# Patient Record
Sex: Female | Born: 1990 | Race: White | Hispanic: No | Marital: Single | Smoking: Never smoker
Health system: Southern US, Community
[De-identification: ages and names within clinical notes are randomized; demographics above are authoritative.]

## PROBLEM LIST (undated history)

## (undated) ENCOUNTER — Inpatient Hospital Stay (HOSPITAL_COMMUNITY): Payer: Self-pay

## (undated) DIAGNOSIS — B019 Varicella without complication: Secondary | ICD-10-CM

## (undated) DIAGNOSIS — Z789 Other specified health status: Secondary | ICD-10-CM

## (undated) DIAGNOSIS — N760 Acute vaginitis: Secondary | ICD-10-CM

## (undated) HISTORY — PX: NO PAST SURGERIES: SHX2092

## (undated) HISTORY — PX: CHOLECYSTECTOMY: SHX55

## (undated) HISTORY — DX: Acute vaginitis: N76.0

## (undated) HISTORY — DX: Varicella without complication: B01.9

---

## 2009-07-13 ENCOUNTER — Encounter: Payer: Self-pay | Admitting: Family Medicine

## 2010-04-17 ENCOUNTER — Ambulatory Visit: Payer: Self-pay | Admitting: Family Medicine

## 2010-04-21 ENCOUNTER — Encounter: Payer: Self-pay | Admitting: Family Medicine

## 2010-04-21 ENCOUNTER — Ambulatory Visit (INDEPENDENT_AMBULATORY_CARE_PROVIDER_SITE_OTHER): Payer: BC Managed Care – PPO | Admitting: Family Medicine

## 2010-04-21 DIAGNOSIS — E663 Overweight: Secondary | ICD-10-CM | POA: Insufficient documentation

## 2010-04-21 DIAGNOSIS — Z87448 Personal history of other diseases of urinary system: Secondary | ICD-10-CM | POA: Insufficient documentation

## 2010-04-21 DIAGNOSIS — R197 Diarrhea, unspecified: Secondary | ICD-10-CM

## 2010-04-21 DIAGNOSIS — F909 Attention-deficit hyperactivity disorder, unspecified type: Secondary | ICD-10-CM

## 2010-04-21 DIAGNOSIS — R51 Headache: Secondary | ICD-10-CM

## 2010-04-21 DIAGNOSIS — F17200 Nicotine dependence, unspecified, uncomplicated: Secondary | ICD-10-CM | POA: Insufficient documentation

## 2010-04-21 DIAGNOSIS — R519 Headache, unspecified: Secondary | ICD-10-CM | POA: Insufficient documentation

## 2010-04-21 DIAGNOSIS — F329 Major depressive disorder, single episode, unspecified: Secondary | ICD-10-CM | POA: Insufficient documentation

## 2010-04-24 ENCOUNTER — Encounter: Payer: Self-pay | Admitting: Family Medicine

## 2010-04-24 ENCOUNTER — Ambulatory Visit: Payer: Self-pay | Admitting: Family Medicine

## 2010-04-25 ENCOUNTER — Ambulatory Visit: Payer: Self-pay | Admitting: Family Medicine

## 2010-05-04 NOTE — Assessment & Plan Note (Signed)
Summary: New pt est care/dt [employer]   Vital Signs:  Patient profile:   20 year old female Height:      61.25 inches Weight:      182.75 pounds BMI:     34.37 O2 Sat:      100 % on Room air Temp:     98.2 degrees F oral Pulse rate:   90 / minute BP sitting:   136 / 87  (right arm) Cuff size:   regular  Vitals Entered By: Francee Piccolo CMA Duncan Dull) (April 21, 2010 1:07 PM)  O2 Flow:  Room air CC: BM shortly after eating, began with just greasy foods, now all foods--cholecystectomy 2 years ago...SP Is Patient Diabetic? No   History of Present Illness: patient is an 20 year old Caucasian femalein today for new patient appointment. Her major complaint is diarrhea. She had a cholecystectomy in 2000 and tenderness had trouble ever since. She describes fecal urgency each time she eats. It is not affected by what she eats or time of day. It is better and worse at intervals but she denies any obvious ever sees bloody or tarry stoo,l no fevers, chills, does have some mild abdominal cramping with movement no other recent illness, CP, palp, GU concerns recently. Has had trouble with UTIs in the past. Has struggled with headaches at times, but no trouble at present. She has not tried any medications for the diarhhea. She reports the problem has gradually worsened since the surgery. Initially she thought it was mostly fatty foods but now it is all foods.  Preventive Screening-Counseling & Management  Alcohol-Tobacco     Smoking Status: current     Smoking Cessation Counseling: YES      Drug Use:  no.    Current Medications (verified): 1)  Nuvaring 0.12-0.015 Mg/24hr Ring (Etonogestrel-Ethinyl Estradiol) .... Change Monthly As Directed  Allergies (verified): 1)  ! Codeine  Past History:  Past Surgical History: Cholecystectomy at age 20  Family History: Father: 39: hyperlipidemia, HTN, overweight Mother: 56: overweight, palpitations Siblings: None MGM: deceased in 10s, breast  cancer MGF: deceased@76 , ankle fracture, CHF, fluid overload PGM: 73, overweight PGF: deceased in 19s, unknown causes Children: None  Social History: Occupation: Software engineer for medical assistance, works at group home Single, lives with parents Current Smoker 1/2 ppd Alcohol use-yes, occasional light Drug use-no No dietary restrictions wears seat beltOccupation:  employed Smoking Status:  current Drug Use:  no  Review of Systems  The patient denies anorexia, fever, weight loss, weight gain, vision loss, decreased hearing, hoarseness, chest pain, syncope, dyspnea on exertion, peripheral edema, prolonged cough, headaches, hemoptysis, abdominal pain, melena, hematochezia, severe indigestion/heartburn, hematuria, incontinence, muscle weakness, suspicious skin lesions, transient blindness, difficulty walking, depression, unusual weight change, abnormal bleeding, and enlarged lymph nodes.    Physical Exam  General:  Well-developed,well-nourished,in no acute distress; alert,appropriate and cooperative throughout examination Head:  Normocephalic and atraumatic without obvious abnormalities. No apparent alopecia or balding. Eyes:  No corneal or conjunctival inflammation noted. EOMI. Perrla. Funduscopic exam benign Ears:  External ear exam shows no significant lesions or deformities.  Otoscopic examination reveals clear canals, tympanic membranes are intact bilaterally without bulging, retraction, inflammation or discharge. Hearing is grossly normal bilaterally. Nose:  External nasal examination shows no deformity or inflammation. Nasal mucosa are pink and moist without lesions or exudates. Mouth:  Oral mucosa and oropharynx without lesions or exudates.  Teeth in good repair. Neck:  No deformities, masses, or tenderness noted. Lungs:  Normal respiratory effort, chest expands  symmetrically. Lungs are clear to auscultation, no crackles or wheezes. Heart:  Normal rate and regular rhythm. S1 and S2 normal  without gallop, murmur, click, rub or other extra sounds. Abdomen:  Bowel sounds positive,abdomen soft and non-tender without masses, organomegaly or hernias noted. Msk:  no joint instability.   Pulses:  R and L carotid, dorsalis pedis and posterior tibial pulses are full and equal bilaterally Extremities:  No clubbing, cyanosis, edema, or deformity noted with normal full range of motion of all joints.   Neurologic:  No cranial nerve deficits noted. Station and gait are normal. Plantar reflexes are down-going bilaterally. DTRs are symmetrical throughout. Sensory, motor and coordinative functions appear intact. Skin:  Intact without suspicious lesions or rashes Cervical Nodes:  No lymphadenopathy noted Psych:  Cognition and judgment appear intact. Alert and cooperative with normal attention span and concentration. No apparent delusions, illusions, hallucinations   Impression & Recommendations:  Problem # 1:  DIARRHEA, CHRONIC (ICD-787.91) Patient has struggled since her cholecystectomy several years ago. She has fecal urgency and intermittent diarrha after eating. encouraged her  to add Benefiber 2 tsp by mouth two times a day add a probiotic and a yogurt daily, avoid excessive simple carbs and maintain hydration, if no improvement may require a GI referral.  Problem # 2:  HEADACHE (ICD-784.0) No recent trouble, encouraged 7-8 hours of sleep nightly, 64 oz clear fluids and report worsening symptoms  Problem # 3:  TOBACCO ABUSE (ICD-305.1)  Orders: Tobacco use cessation intermediate 3-10 minutes (16109) Encouraged complete cessation, patient noncommital  Problem # 4:  ADHD (ICD-314.01) Patient agrees to see Behavioral Health to be evaluated for ADHD and then would consider medications, finds herselt with difficulty concentrating both at school and at home  Complete Medication List: 1)  Nuvaring 0.12-0.015 Mg/24hr Ring (Etonogestrel-ethinyl estradiol) .... Change monthly as  directed  Patient Instructions: 1)  Please schedule a follow-up appointment in 1 to 2 months 2)  Call Judithe Modest, MSW for ADHD evaluation [phone] to schedule 3)  Avoid fatty food, especially trans fats/partially hydrogenated oils 4)  Start Benefiber 2 tsp by mouth two times a day, increase roughage and whole grains in diet. Start a probiotic such as Glass blower/designer daily and a yogurt daily, such as Activia or DanActive. 5)  64 oz of clear fluids daily 6)  Tobacco is very bad for your health and your loved ones ! You should stop smoking !  7)  Stop smoking tips: Choose a quit date. Cut down before the quit date. Decide what you will do as a substitute when you feel the urge to smoke(gum, toothpick, exercise).  8)  labs: FLP, lft, cbc, renal, tsh, esr, celiac panel when lab opens   Orders Added: 1)  Tobacco use cessation intermediate 3-10 minutes [99406] 2)  New Patient 18-39 years [99385]   Immunization History:  Influenza Immunization History:    Influenza:  historical (02/26/2010)

## 2010-05-08 ENCOUNTER — Encounter: Payer: Self-pay | Admitting: Family Medicine

## 2010-05-16 NOTE — Letter (Signed)
Summary: Records [facility]  Records [place] [facility] [place]   Imported By: Lester [place] 05/08/2010 10:18:06  _____________________________________________________________________  External Attachment:    Type:   Image     Comment:   External Document

## 2010-05-19 ENCOUNTER — Telehealth: Payer: Self-pay | Admitting: Family Medicine

## 2010-05-22 NOTE — Telephone Encounter (Signed)
Left message for patient to call back to reschedule her appointment for 4.6.2012. Appointment has been cancelled in system.

## 2010-06-02 ENCOUNTER — Ambulatory Visit: Payer: BC Managed Care – PPO | Admitting: Family Medicine

## 2010-12-12 ENCOUNTER — Encounter: Payer: Self-pay | Admitting: Family Medicine

## 2010-12-12 ENCOUNTER — Ambulatory Visit (INDEPENDENT_AMBULATORY_CARE_PROVIDER_SITE_OTHER): Payer: BC Managed Care – PPO | Admitting: Family Medicine

## 2010-12-12 ENCOUNTER — Other Ambulatory Visit (HOSPITAL_COMMUNITY)
Admission: RE | Admit: 2010-12-12 | Discharge: 2010-12-12 | Disposition: A | Discharge status: Home / Self Care | Payer: BC Managed Care – PPO | Source: Ambulatory Visit | Attending: Family Medicine | Admitting: Family Medicine

## 2010-12-12 VITALS — BP 111/75 | HR 83 | Temp 98.3°F | Ht 61.25 in | Wt 184.4 lb

## 2010-12-12 DIAGNOSIS — F17200 Nicotine dependence, unspecified, uncomplicated: Secondary | ICD-10-CM

## 2010-12-12 DIAGNOSIS — Z23 Encounter for immunization: Secondary | ICD-10-CM

## 2010-12-12 DIAGNOSIS — R5383 Other fatigue: Secondary | ICD-10-CM

## 2010-12-12 DIAGNOSIS — Z01419 Encounter for gynecological examination (general) (routine) without abnormal findings: Secondary | ICD-10-CM | POA: Insufficient documentation

## 2010-12-12 DIAGNOSIS — Z0000 Encounter for general adult medical examination without abnormal findings: Secondary | ICD-10-CM | POA: Insufficient documentation

## 2010-12-12 DIAGNOSIS — N760 Acute vaginitis: Secondary | ICD-10-CM | POA: Insufficient documentation

## 2010-12-12 DIAGNOSIS — Z0184 Encounter for antibody response examination: Secondary | ICD-10-CM

## 2010-12-12 DIAGNOSIS — F329 Major depressive disorder, single episode, unspecified: Secondary | ICD-10-CM

## 2010-12-12 DIAGNOSIS — R5381 Other malaise: Secondary | ICD-10-CM

## 2010-12-12 LAB — RENAL FUNCTION PANEL
GFR: 105.21 mL/min (ref >60.00)
Glucose, Bld: 57 mg/dL — ABNORMAL LOW (ref 70–99)
Phosphorus: 3.8 mg/dL (ref 2.3–4.6)
Potassium: 3.7 mEq/L (ref 3.5–5.1)
Sodium: 139 mEq/L (ref 135–145)

## 2010-12-12 LAB — LIPID PANEL
Cholesterol: 203 mg/dL — ABNORMAL HIGH (ref 0–200)
Total CHOL/HDL Ratio: 2

## 2010-12-12 LAB — CBC
HCT: 37.3 % (ref 36.0–46.0)
Hemoglobin: 12.5 g/dL (ref 12.0–15.0)
MCH: 28.9 pg (ref 26.0–34.0)
MCHC: 33.5 g/dL (ref 30.0–36.0)
MCV: 86.1 fL (ref 78.0–100.0)

## 2010-12-12 LAB — LDL CHOLESTEROL, DIRECT: Direct LDL: 108.4 mg/dL

## 2010-12-12 LAB — TSH: TSH: 0.29 u[IU]/mL — ABNORMAL LOW (ref 0.35–5.50)

## 2010-12-12 MED ORDER — TETANUS-DIPHTH-ACELL PERTUSSIS 5-2.5-18.5 LF-MCG/0.5 IM SUSP: 0.5000 mL | Freq: Once | INTRAMUSCULAR | Status: DC

## 2010-12-12 MED ORDER — TUBERCULIN PPD 5 UNIT/0.1ML ID SOLN
5.0000 [IU] | Freq: Once | INTRADERMAL | Status: AC
  Administered 2010-12-12: 5 [IU] via INTRADERMAL

## 2010-12-12 NOTE — Progress Notes (Signed)
Nina Nguyen [id number] 04/07/1990 12/12/2010      Progress Note New Patient  Subjective  Chief Complaint  Chief Complaint  Patient presents with  . Annual Exam    physical  . Gynecologic Exam    pap smear  . Immunizations    for school    HPI  Patient is a 20 year old Caucasian female in today for annual exam. 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 She reports she feels well. No recent illness, fevers, chills, chest pain, palpitations, shortness of breath, GI or GU complaints and was at age 13, last period was a week ago was normal. No dysmenorrhea and periods last roughly 5 days in she is a G0 P0. Denies any history of abnormal Paps, lesions, discharge or concerns.  Past Medical History  Diagnosis Date  . Chicken pox as a child    Past Surgical History  Procedure Date  . Cholecystectomy 20 yrs old    Family History  Problem Relation Age of Onset  . Obesity Mother   . Other Mother     palpitations  . Hyperlipidemia Father   . Hypertension Father   . Obesity Father   . Cancer Maternal Grandmother     Breast  . Other Maternal Grandfather     CHF/ fluid overload/ ankle fracture  . Obesity Paternal Grandmother     History   Social History  . Marital Status: Single    Spouse Name: N/A    Number of Children: N/A  . Years of Education: N/A   Occupational History  . Not on file.   Social History Main Topics  . Smoking status: Current Everyday Smoker -- 1.0 packs/day    Types: Cigarettes  . Smokeless tobacco: Never Used  . Alcohol Use: 0.0 oz/week    0 drink(s) per week     occasional, light  . Drug Use: No  . Sexually Active: Yes -- Female partner(s)   Other Topics Concern  . Not on file   Social History Narrative  . No narrative on file    Current Outpatient Prescriptions on File Prior to Visit  Medication Sig Dispense Refill  . etonogestrel-ethinyl estradiol (NUVARING) 0.12-0.015 MG/24HR vaginal ring  Place 1 each vaginally every 28 (twenty-eight) days. Insert vaginally and leave in place for 3 consecutive weeks, then remove for 1 week.        No current facility-administered medications on file prior to visit.    Allergies  Allergen Reactions  . Codeine     Review of Systems  Review of Systems  Constitutional: Negative for fever, chills and malaise/fatigue.  HENT: Negative for hearing loss, nosebleeds and congestion.   Eyes: Negative for pain and discharge.  Respiratory: Negative for cough, sputum production, shortness of breath and wheezing.   Cardiovascular: Negative for chest pain, palpitations and leg swelling.  Gastrointestinal: Negative for heartburn, nausea, vomiting, abdominal pain, diarrhea, constipation and blood in stool.  Genitourinary: Negative for dysuria, urgency, frequency and hematuria.  Musculoskeletal: Negative for myalgias, back pain and falls.  Skin: Negative for rash.  Neurological: Negative for dizziness, tremors, sensory change, focal weakness, loss of consciousness, weakness and headaches.  Endo/Heme/Allergies: Negative for polydipsia. Does not bruise/bleed easily.  Psychiatric/Behavioral: Negative for depression and suicidal ideas. The patient is not nervous/anxious and does not have insomnia.     Objective  BP 111/75  Pulse 83  Temp(Src) 98.3 F (36.8 C) (Oral)  Ht 5' 1.25" (1.556  m)  Wt 184 lb 6.4 oz (83.643 kg)  BMI 34.56 kg/m2  SpO2 99%  LMP 12/07/2010  Physical Exam  Physical Exam  Constitutional: She is oriented to person, place, and time and well-developed, well-nourished, and in no distress. No distress.  HENT:  Head: Normocephalic and atraumatic.  Right Ear: External ear normal.  Left Ear: External ear normal.  Nose: Nose normal.  Mouth/Throat: No oropharyngeal exudate.  Eyes: Conjunctivae are normal.  Neck: Neck supple. No thyromegaly present.  Cardiovascular: Normal rate, regular rhythm and normal heart sounds.   No murmur  heard. Pulmonary/Chest: Effort normal and breath sounds normal. She has no wheezes.  Abdominal: She exhibits no distension and no mass.  Genitourinary: Cervix normal, right adnexa normal and left adnexa normal. Vaginal discharge found.       Breast exam unremarkable except for piercing  Musculoskeletal: She exhibits no edema.  Lymphadenopathy:    She has no cervical adenopathy.  Neurological: She is alert and oriented to person, place, and time.  Skin: Skin is warm and dry. No rash noted. She is not diaphoretic.  Psychiatric: Memory, affect and judgment normal.       Assessment & Plan  DEPRESSION Doing well without medications at this time. 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 Pap smear taken today  TOBACCO ABUSE Still smoking, encouraged complete cessation. counseled for greater than 3 minutes

## 2010-12-12 NOTE — Patient Instructions (Signed)

## 2010-12-12 NOTE — Assessment & Plan Note (Signed)
Still smoking, encouraged complete cessation. counseled for greater than 3 minutes

## 2010-12-12 NOTE — Assessment & Plan Note (Signed)
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 Pap smear taken today

## 2010-12-12 NOTE — Assessment & Plan Note (Signed)
Doing well without medications at this time. No need for treatment at this time

## 2010-12-13 LAB — T4, FREE: Free T4: 0.95 ng/dL (ref 0.60–1.60)

## 2010-12-13 LAB — VARICELLA ZOSTER ANTIBODY, IGG: Varicella IgG: 2.47 {ISR} — ABNORMAL HIGH

## 2010-12-13 LAB — HEPATITIS PANEL, ACUTE: Hep B C IgM: NEGATIVE

## 2010-12-13 NOTE — Progress Notes (Signed)
Nina Nguyen is checking to see if the free T4 can be added

## 2010-12-14 ENCOUNTER — Ambulatory Visit (INDEPENDENT_AMBULATORY_CARE_PROVIDER_SITE_OTHER): Payer: BC Managed Care – PPO

## 2010-12-14 DIAGNOSIS — Z23 Encounter for immunization: Secondary | ICD-10-CM

## 2010-12-14 LAB — TB SKIN TEST: Induration: 48

## 2010-12-18 MED ORDER — METRONIDAZOLE 500 MG PO TABS: 500.0000 mg | ORAL_TABLET | Freq: Two times a day (BID) | ORAL | Status: AC

## 2010-12-18 NOTE — Progress Notes (Signed)
Addended by: Court Joy on: 12/18/2010 03:44 PM   Modules accepted: Orders

## 2010-12-26 ENCOUNTER — Telehealth: Payer: Self-pay

## 2010-12-26 NOTE — Telephone Encounter (Signed)
Pt informed of pap results.  

## 2011-01-12 ENCOUNTER — Ambulatory Visit: Payer: BC Managed Care – PPO

## 2011-01-19 ENCOUNTER — Ambulatory Visit: Payer: Self-pay

## 2011-01-22 ENCOUNTER — Ambulatory Visit (INDEPENDENT_AMBULATORY_CARE_PROVIDER_SITE_OTHER): Payer: BC Managed Care – PPO

## 2011-01-22 DIAGNOSIS — Z23 Encounter for immunization: Secondary | ICD-10-CM

## 2011-01-22 MED ORDER — TUBERCULIN PPD 5 UNIT/0.1ML ID SOLN: 5.0000 [IU] | Freq: Once | INTRADERMAL | Status: DC

## 2011-01-24 LAB — TB SKIN TEST: TB Skin Test: NEGATIVE mm

## 2011-02-02 ENCOUNTER — Encounter: Payer: Self-pay | Admitting: Family Medicine

## 2011-02-02 ENCOUNTER — Ambulatory Visit (INDEPENDENT_AMBULATORY_CARE_PROVIDER_SITE_OTHER): Payer: BC Managed Care – PPO | Admitting: Family Medicine

## 2011-02-02 ENCOUNTER — Other Ambulatory Visit (HOSPITAL_COMMUNITY)
Admission: RE | Admit: 2011-02-02 | Discharge: 2011-02-02 | Disposition: A | Discharge status: Home / Self Care | Payer: BC Managed Care – PPO | Source: Ambulatory Visit | Attending: Family Medicine | Admitting: Family Medicine

## 2011-02-02 VITALS — BP 118/81 | HR 102 | Temp 98.3°F | Ht 61.25 in | Wt 189.1 lb

## 2011-02-02 DIAGNOSIS — N760 Acute vaginitis: Secondary | ICD-10-CM | POA: Insufficient documentation

## 2011-02-02 DIAGNOSIS — R350 Frequency of micturition: Secondary | ICD-10-CM

## 2011-02-02 DIAGNOSIS — Z87448 Personal history of other diseases of urinary system: Secondary | ICD-10-CM

## 2011-02-02 HISTORY — DX: Acute vaginitis: N76.0

## 2011-02-02 LAB — POCT URINALYSIS DIPSTICK
Blood, UA: NEGATIVE
Glucose, UA: NEGATIVE
Spec Grav, UA: 1.02
Urobilinogen, UA: 0.2

## 2011-02-02 MED ORDER — PHENAZOPYRIDINE HCL 100 MG PO TABS: 100.0000 mg | ORAL_TABLET | Freq: Three times a day (TID) | ORAL | Status: AC | PRN

## 2011-02-02 MED ORDER — CIPROFLOXACIN HCL 500 MG PO TABS: 500.0000 mg | ORAL_TABLET | Freq: Two times a day (BID) | ORAL | Status: AC

## 2011-02-02 NOTE — Assessment & Plan Note (Signed)
Exam consistent with low grade vaginitis. Encouraged to start a probiotic and yogurt daily. Avoid harsh soaps. Minimize carbs, blow dry and consider using Witch Hazel Astringent externally. Use Cranberry tabs daily

## 2011-02-02 NOTE — Patient Instructions (Addendum)
Vaginitis Vaginitis is an infection. It causes soreness, swelling, and redness (inflammation) of the vagina. Many of these infections are sexually transmitted diseases. Having unprotected sex can cause further problems and complications such as:  Chronic pelvic pain.   Infertility.   Unwanted pregnancy.   Abortions.   Tubal pregnancy.   Infection passed on to the newborn baby.   Cancer.  CAUSES   Monilia. A yeast/fungus infection, not a sexually transmitted disease.   Bacterial vaginosis. The normal balance of bacteria in the vagina is disrupted and is replaced by an overgrowth of certain bacteria.   Gonorrhea, chlamydia. These are bacterial infections that are sexually transmitted diseases (STD).   Vaginal sponges, diaphragms, and intrauterine devices.   Trichomoniasis. This is a protozoa parasite that is a STD.   Viruses like herpes and human papilloma virus. Both are STD's.   Pregnancy.   Immunosupression (HIV).   Using bubble bath.   Taking certain medications that kill germs (antibiotics).   Sporadic recurrence can occur if you become ill.   Diabetes.   Steriods.   Allergic reaction. If you have an allergy to:   Douches.   Soaps.   Spermicides.   Condoms.   Scented tampons and vaginal sprays.  SYMPTOMS   Abnormal vaginal discharge.   Itching of the vagina.   Pain in the vagina.   Swelling of the vagina.   In some cases, there are no symptoms.  TREATMENT  Treatment will vary depending on the type of infection.  Bacteria and trichomonas - usually oral antibiotics and sometimes vaginal cream or suppositories.   Monilia vaginitis - vaginal creams, suppositories, or oral antifungal.   Viral vaginitis has no cure. However, the symptoms of herpes (a viral vaginitis) can be treated to relieve the discomfort. Human papilloma virus has no symptoms . However, there are treatments for the diseases caused by human papilloma virus.   Allergic  vaginitis. Stop using the product that is causing the problem. Vaginal creams can be used to treat the symptoms.   When treating an STD, the sex partner should also be treated.  HOME CARE INSTRUCTIONS   Take all the medications as prescribed by your caregiver.   Do not use scented tampons, soaps, or vaginal sprays.   Do not douche.   Tell your sex partner if you have a vaginal infection or a STD.   Do not have sexual intercourse until you have cured the vaginitis.   Practice safe sex by using condoms .  SEEK MEDICAL CARE IF:   You have an oral temperature above 102 F (38.9 C).   You develop abdominal pain.   Your symptoms get worse during treatment.  Document Released: 12/10/2006 Document Revised: 08/28/2010 Document Reviewed: 08/05/2008 [facility] Patient Information 2012 [place], [place].  Increase fluids, cotton undergarments, blow dry, no heavy soaps, consider cleanse with Witch Hazel Astringent now and Distilled whtie vinegar in future if symptoms return, minimize carbs

## 2011-02-02 NOTE — Progress Notes (Signed)
Nina Nguyen [id number] 10-09-1990 02/02/2011      Progress Note-Follow Up  Subjective  Chief Complaint  Chief Complaint  Patient presents with  . Urinary Tract Infection    frequent urination and sour odor X 5 days- no itching or burning    HPI  This is a 20 year old Caucasian female who is in today with a 5-7 day history of increased vaginal discharge without odor. She denies any itching or burning. She does not some urinary frequency but denies any urgency she has had some mild low back pain the last few days but no abdominal pain. She denies any change in bowel habits, constipation diarrhea or bloody stool. She had been fevers or chills. She has a single partner but has not been active in time and offers no concerns in this regard.  Past Medical History  Diagnosis Date  . Chicken pox as a child    Past Surgical History  Procedure Date  . Cholecystectomy 20 yrs old    Family History  Problem Relation Age of Onset  . Obesity Mother   . Other Mother     palpitations  . Hyperlipidemia Father   . Hypertension Father   . Obesity Father   . Cancer Maternal Grandmother     Breast  . Other Maternal Grandfather     CHF/ fluid overload/ ankle fracture  . Obesity Paternal Grandmother     History   Social History  . Marital Status: Single    Spouse Name: N/A    Number of Children: N/A  . Years of Education: N/A   Occupational History  . Not on file.   Social History Main Topics  . Smoking status: Current Everyday Smoker -- 1.0 packs/day    Types: Cigarettes  . Smokeless tobacco: Never Used  . Alcohol Use: 0.0 oz/week    0 drink(s) per week     occasional, light  . Drug Use: No  . Sexually Active: Yes -- Female partner(s)   Other Topics Concern  . Not on file   Social History Narrative  . No narrative on file    Current Outpatient Prescriptions on File Prior to Visit  Medication Sig Dispense Refill  . etonogestrel-ethinyl estradiol (NUVARING) 0.12-0.015  MG/24HR vaginal ring Place 1 each vaginally every 28 (twenty-eight) days. Insert vaginally and leave in place for 3 consecutive weeks, then remove for 1 week.         Allergies  Allergen Reactions  . Codeine     Review of Systems  Review of Systems  Constitutional: Negative for fever and malaise/fatigue.  HENT: Negative for congestion.   Eyes: Negative for discharge.  Respiratory: Negative for shortness of breath.   Cardiovascular: Negative for chest pain, palpitations and leg swelling.  Gastrointestinal: Negative for nausea, abdominal pain and diarrhea.  Genitourinary: Negative for dysuria.  Musculoskeletal: Negative for falls.  Skin: Negative for rash.  Neurological: Negative for loss of consciousness and headaches.  Endo/Heme/Allergies: Negative for polydipsia.  Psychiatric/Behavioral: Negative for depression and suicidal ideas. The patient is not nervous/anxious and does not have insomnia.     Objective  BP 118/81  Pulse 102  Temp(Src) 98.3 F (36.8 C) (Oral)  Ht 5' 1.25" (1.556 m)  Wt 189 lb 1.9 oz (85.784 kg)  BMI 35.44 kg/m2  SpO2 97%  LMP 01/19/2011  Physical Exam  Physical Exam  Constitutional: She is oriented to person, place, and time and well-developed, well-nourished, and in no distress. No distress.  HENT:  Head: Normocephalic  and atraumatic.  Eyes: Conjunctivae are normal.  Neck: Neck supple. No thyromegaly present.  Cardiovascular: Normal rate, regular rhythm and normal heart sounds.   No murmur heard. Pulmonary/Chest: Effort normal and breath sounds normal. She has no wheezes.  Abdominal: She exhibits no distension and no mass.  Genitourinary: Cervix normal. Vaginal discharge found.       Thick, white grayish discharge  Musculoskeletal: She exhibits no edema.  Lymphadenopathy:    She has no cervical adenopathy.  Neurological: She is alert and oriented to person, place, and time.  Skin: Skin is warm and dry. No rash noted. She is not diaphoretic.   Psychiatric: Memory, affect and judgment normal.    Lab Results  Component Value Date   TSH 0.29* 12/12/2010   Lab Results  Component Value Date   WBC 3.9* 12/12/2010   HGB 12.5 12/12/2010   HCT 37.3 12/12/2010   MCV 86.1 12/12/2010   PLT 308 12/12/2010   Lab Results  Component Value Date   CREATININE 0.7 12/12/2010   BUN 8 12/12/2010   NA 139 12/12/2010   K 3.7 12/12/2010   CL 105 12/12/2010   CO2 27 12/12/2010   No results found for this basename: ALT, AST, GGT, ALKPHOS, BILITOT   Lab Results  Component Value Date   CHOL 203* 12/12/2010   Lab Results  Component Value Date   HDL 84.60 12/12/2010    Lab Results  Component Value Date   TRIG 57.0 12/12/2010   Lab Results  Component Value Date   CHOLHDL 2 12/12/2010     Assessment & Plan   Vaginitis Exam consistent with low grade vaginitis. Encouraged to start a probiotic and yogurt daily. Avoid harsh soaps. Minimize carbs, blow dry and consider using Witch Hazel Astringent externally. Use Cranberry tabs daily  UTI'S, HX OF Urine is sent for culture it does have some ketones and WBCs in it. Encouraged cranberry tabs daily, increase hydration. If symptoms worsen, pain or fevers develop has prescriptions on hold for Cipro and Pyridium to p/u and use

## 2011-02-02 NOTE — Assessment & Plan Note (Signed)
Urine is sent for culture it does have some ketones and WBCs in it. Encouraged cranberry tabs daily, increase hydration. If symptoms worsen, pain or fevers develop has prescriptions on hold for Cipro and Pyridium to p/u and use

## 2011-02-04 LAB — URINE CULTURE

## 2011-03-12 ENCOUNTER — Other Ambulatory Visit: Payer: Self-pay | Admitting: Obstetrics and Gynecology

## 2011-04-24 ENCOUNTER — Ambulatory Visit (INDEPENDENT_AMBULATORY_CARE_PROVIDER_SITE_OTHER): Payer: PRIVATE HEALTH INSURANCE

## 2011-04-24 DIAGNOSIS — Z23 Encounter for immunization: Secondary | ICD-10-CM

## 2011-05-08 ENCOUNTER — Telehealth: Payer: Self-pay

## 2011-05-08 NOTE — Telephone Encounter (Signed)
Patients mother called asking for Rex's Varicella results to be sent to [facility]. Results faxed to [phone]

## 2011-05-24 ENCOUNTER — Ambulatory Visit (INDEPENDENT_AMBULATORY_CARE_PROVIDER_SITE_OTHER): Payer: BC Managed Care – PPO | Admitting: Family Medicine

## 2011-05-24 ENCOUNTER — Encounter: Payer: Self-pay | Admitting: Family Medicine

## 2011-05-24 VITALS — BP 132/96 | HR 94 | Temp 97.8°F | Ht 61.25 in | Wt 183.8 lb

## 2011-05-24 DIAGNOSIS — A084 Viral intestinal infection, unspecified: Secondary | ICD-10-CM | POA: Insufficient documentation

## 2011-05-24 DIAGNOSIS — A09 Infectious gastroenteritis and colitis, unspecified: Secondary | ICD-10-CM

## 2011-05-24 NOTE — Progress Notes (Signed)
OFFICE NOTE  05/24/2011  CC:  Chief Complaint  Patient presents with  . Diarrhea    X 2 days  . Emesis    X 2 days     HPI: Patient is a 21 y.o. Caucasian female who is here for vomiting and diarrhea. Onset about 48h ago with both n/v/d.  Vomiting stopped yesterday. Diarrhea and stomach cramping continue.  Drinking water and ginger ale. Diarrhea was q62min initially and now is about q2h--explosive. No blood or mucous.  No fever, no rash.   +Contact with people with similar sx's.  Has taken no antidiarrheals. Has lost about 5 lb with this illness. Feels achy and fatigued still.  No abx in last few months.   Pertinent PMH:  Past Medical History  Diagnosis Date  . Chicken pox as a child  . Vaginitis 02/02/2011    MEDS:  Outpatient Prescriptions Prior to Visit  Medication Sig Dispense Refill  . etonogestrel-ethinyl estradiol (NUVARING) 0.12-0.015 MG/24HR vaginal ring Place 1 each vaginally every 28 (twenty-eight) days. Insert vaginally and leave in place for 3 consecutive weeks, then remove for 1 week.         PE: Blood pressure 132/96, pulse 94, temperature 97.8 F (36.6 C), temperature source Temporal, height 5' 1.25" (1.556 m), weight 183 lb 12.8 oz (83.371 kg), last menstrual period 05/23/2011, SpO2 98.00%. Gen: Alert, well appearing.  Patient is oriented to person, place, time, and situation. ENT: Ears: EACs clear, normal epithelium.  TMs with good light reflex and landmarks bilaterally.  Eyes: no injection, icteris, swelling, or exudate.  EOMI, PERRLA. Nose: no drainage or turbinate edema/swelling.  No injection or focal lesion.  Mouth: lips without lesion/swelling.  Oral mucosa pink and moist.  Dentition intact and without obvious caries or gingival swelling.  Oropharynx without erythema, exudate, or swelling.  Neck - No masses or thyromegaly or limitation in range of motion CV: RRR, no m/r/g.   LUNGS: CTA bilat, nonlabored resps, good aeration in all lung fields. ABD:  soft, NT, ND, BS normal.  No hepatospenomegaly or mass.  No bruits. EXT: no clubbing, cyanosis, or edema.    IMPRESSION AND PLAN:  Viral gastroenteritis Gradually resolving. Self-limited nature of this illness was discussed, questions answered.  Discussed symptomatic care, rest, fluids.   Warning signs/symptoms of worsening illness were discussed.  Patient instructed to call or return if any of these occur. Note for work written today.      FOLLOW UP: prn

## 2011-05-24 NOTE — Patient Instructions (Signed)
Drink gatorade or poweraid---mixed with water in a 50/50 mixture.

## 2011-05-28 NOTE — Assessment & Plan Note (Signed)
Gradually resolving. Self-limited nature of this illness was discussed, questions answered.  Discussed symptomatic care, rest, fluids.   Warning signs/symptoms of worsening illness were discussed.  Patient instructed to call or return if any of these occur. Note for work written today.

## 2011-12-21 ENCOUNTER — Telehealth: Payer: Self-pay | Admitting: Family Medicine

## 2011-12-21 NOTE — Telephone Encounter (Signed)
I printed immunization record but it only shows 2 (Hep B), flu shot from last year, and 1 tdap.

## 2011-12-21 NOTE — Telephone Encounter (Signed)
Patient needs immunization records for her job. She is authorizing her mother to come pick it up today.

## 2012-08-13 ENCOUNTER — Ambulatory Visit: Payer: BC Managed Care – PPO | Admitting: Nurse Practitioner

## 2012-09-08 ENCOUNTER — Other Ambulatory Visit: Payer: Self-pay | Admitting: Obstetrics and Gynecology

## 2013-06-26 LAB — OB RESULTS CONSOLE GC/CHLAMYDIA
Chlamydia: NEGATIVE
GC PROBE AMP, GENITAL: NEGATIVE

## 2013-07-28 LAB — OB RESULTS CONSOLE RPR: RPR: NONREACTIVE

## 2013-09-28 LAB — OB RESULTS CONSOLE RUBELLA ANTIBODY, IGM: Rubella: IMMUNE

## 2013-09-29 ENCOUNTER — Other Ambulatory Visit: Payer: Self-pay | Admitting: Obstetrics and Gynecology

## 2013-10-01 LAB — CYTOLOGY - PAP

## 2013-10-28 ENCOUNTER — Inpatient Hospital Stay (HOSPITAL_COMMUNITY)
Admission: AD | Admit: 2013-10-28 | Discharge: 2013-10-28 | Disposition: A | Discharge status: Home / Self Care | Payer: Managed Care, Other (non HMO) | Source: Ambulatory Visit | Attending: Obstetrics and Gynecology | Admitting: Obstetrics and Gynecology

## 2013-10-28 ENCOUNTER — Encounter (HOSPITAL_COMMUNITY): Payer: Self-pay | Admitting: *Deleted

## 2013-10-28 DIAGNOSIS — O219 Vomiting of pregnancy, unspecified: Secondary | ICD-10-CM

## 2013-10-28 DIAGNOSIS — O210 Mild hyperemesis gravidarum: Secondary | ICD-10-CM | POA: Insufficient documentation

## 2013-10-28 HISTORY — DX: Other specified health status: Z78.9

## 2013-10-28 LAB — URINALYSIS, ROUTINE W REFLEX MICROSCOPIC
BILIRUBIN URINE: NEGATIVE
GLUCOSE, UA: 100 mg/dL — AB
HGB URINE DIPSTICK: NEGATIVE
Ketones, ur: NEGATIVE mg/dL
Leukocytes, UA: NEGATIVE
Nitrite: NEGATIVE
Protein, ur: NEGATIVE mg/dL
UROBILINOGEN UA: 0.2 mg/dL (ref 0.0–1.0)
pH: 6 (ref 5.0–8.0)

## 2013-10-28 MED ORDER — DEXTROSE 5 % IN LACTATED RINGERS IV BOLUS
1000.0000 mL | Freq: Once | INTRAVENOUS | Status: AC
  Administered 2013-10-28: 1000 mL via INTRAVENOUS

## 2013-10-28 NOTE — MAU Note (Signed)
Pt reports she has been having non stop n/v since Monday. Got better last night just nauseated this morning. Went to work and stated she almost passed out.  cowork took her b/o and it was 70/40 . Sent home called doctor and instructed to come to MAU

## 2013-10-28 NOTE — MAU Provider Note (Signed)
History     CSN: [id number]  Arrival date and time: 10/28/13 1100   First Provider Initiated Contact with Patient 10/28/13 1154      Chief Complaint  Patient presents with  . Emesis During Pregnancy   HPI  Nina Nguyen is a 23 y.o. female G1P0 at [redacted]w[redacted]d who presents to MAU with nausea and vomiting. She started vomiting Monday morning and vomited every 20 mins for 24 hours. She has not vomited today, however feels very nauseated. She felt fine this morning and tried to go to work. She had her BP checked at work and it was 70/40; she was instructed to come to MAU by her Dr.   [name] note:  Pt reports she has been having non stop n/v since Monday. Got better last night just nauseated this morning. Went to work and stated she almost passed out. cowork took her b/o and it was 70/40 . Sent home called doctor and instructed to come to MAU    OB History   Grav Para Term Preterm Abortions TAB SAB Ect Mult Living   1               Past Medical History  Diagnosis Date  . Chicken pox as a child  . Vaginitis 02/02/2011  . Medical history non-contributory     Past Surgical History  Procedure Laterality Date  . Cholecystectomy  23 yrs old  . No past surgeries      Family History  Problem Relation Age of Onset  . Obesity Mother   . Other Mother     palpitations  . Hyperlipidemia Father   . Hypertension Father   . Obesity Father   . Cancer Maternal Grandmother     Breast  . Other Maternal Grandfather     CHF/ fluid overload/ ankle fracture  . Obesity Paternal Grandmother     History  Substance Use Topics  . Smoking status: Never Smoker   . Smokeless tobacco: Never Used  . Alcohol Use: No     Comment: occasional, light    Allergies:  Allergies  Allergen Reactions  . Codeine Hives    Prescriptions prior to admission  Medication Sig Dispense Refill  . Doxylamine-Pyridoxine (DICLEGIS PO) Take 1 tablet by mouth daily.      . Prenatal Vit-Fe Fumarate-FA (PRENATAL  MULTIVITAMIN) TABS tablet Take 1 tablet by mouth daily at 12 noon.       Results for orders placed during the hospital encounter of 10/28/13 (from the past 48 hour(s))  URINALYSIS, ROUTINE W REFLEX MICROSCOPIC     Status: Abnormal   Collection Time    10/28/13 11:10 AM      Result Value Ref Range   Color, Urine YELLOW  YELLOW   APPearance CLEAR  CLEAR   Specific Gravity, Urine <1.005 (*) 1.005 - 1.030   pH 6.0  5.0 - 8.0   Glucose, UA 100 (*) NEGATIVE mg/dL   Hgb urine dipstick NEGATIVE  NEGATIVE   Bilirubin Urine NEGATIVE  NEGATIVE   Ketones, ur NEGATIVE  NEGATIVE mg/dL   Protein, ur NEGATIVE  NEGATIVE mg/dL   Urobilinogen, UA 0.2  0.0 - 1.0 mg/dL   Nitrite NEGATIVE  NEGATIVE   Leukocytes, UA NEGATIVE  NEGATIVE   Comment: MICROSCOPIC NOT DONE ON URINES WITH NEGATIVE PROTEIN, BLOOD, LEUKOCYTES, NITRITE, OR GLUCOSE <1000 mg/dL.     Review of Systems  Constitutional: Negative for fever and chills.  Gastrointestinal: Positive for nausea and vomiting. Negative for heartburn,  abdominal pain, diarrhea and constipation.  Genitourinary: Negative for dysuria, urgency, frequency and hematuria.       No vaginal discharge. No vaginal bleeding. No dysuria.    Physical Exam   Blood pressure 109/60, pulse 75, temperature 98.6 F (37 C), temperature source Oral, resp. rate 18, height  (1.549 m), weight 82.373 kg (181 lb 9.6 oz), last menstrual period 07/21/2013, SpO2 100.00%.  Physical Exam  Constitutional: She is oriented to person, place, and time. She appears well-developed and well-nourished. No distress.  HENT:  Head: Normocephalic.  Eyes: Pupils are equal, round, and reactive to light.  Neck: Neck supple.  Respiratory: Effort normal.  Musculoskeletal: Normal range of motion.  Neurological: She is alert and oriented to person, place, and time.  Skin: Skin is warm. She is not diaphoretic.  Psychiatric: Her behavior is normal.    MAU Course  Procedures None  MDM +fht   UA 1 liter of fluid Pt tolerating food and liquids in MAU  Discussed patient with Dr. Tenny Craw; ok to discharge the patient home and have her follow up as scheduled.   Assessment and Plan   A:  1. Nausea and vomiting during pregnancy    P:  Discharge home in stable condition Follow up with OB as scheduled Return to MAU if symptoms worsen BRAT diet Note given to be excused from work  Debbrah Alar, NP 10/28/2013, 11:54 AM

## 2013-12-11 LAB — OB RESULTS CONSOLE HIV ANTIBODY (ROUTINE TESTING): HIV: NONREACTIVE

## 2013-12-16 ENCOUNTER — Other Ambulatory Visit (HOSPITAL_COMMUNITY): Payer: Self-pay

## 2013-12-22 ENCOUNTER — Other Ambulatory Visit (HOSPITAL_COMMUNITY): Payer: Self-pay | Admitting: Obstetrics and Gynecology

## 2013-12-22 DIAGNOSIS — O283 Abnormal ultrasonic finding on antenatal screening of mother: Secondary | ICD-10-CM

## 2013-12-22 DIAGNOSIS — Z3689 Encounter for other specified antenatal screening: Secondary | ICD-10-CM

## 2013-12-22 DIAGNOSIS — Z3A23 23 weeks gestation of pregnancy: Secondary | ICD-10-CM

## 2013-12-28 ENCOUNTER — Encounter (HOSPITAL_COMMUNITY): Payer: Self-pay | Admitting: *Deleted

## 2013-12-30 ENCOUNTER — Ambulatory Visit (HOSPITAL_COMMUNITY)
Admission: RE | Admit: 2013-12-30 | Discharge: 2013-12-30 | Disposition: A | Discharge status: Home / Self Care | Payer: Managed Care, Other (non HMO) | Source: Ambulatory Visit | Attending: Obstetrics and Gynecology | Admitting: Obstetrics and Gynecology

## 2013-12-30 ENCOUNTER — Encounter (HOSPITAL_COMMUNITY): Payer: Self-pay

## 2013-12-30 DIAGNOSIS — O283 Abnormal ultrasonic finding on antenatal screening of mother: Secondary | ICD-10-CM

## 2013-12-30 DIAGNOSIS — Z3A23 23 weeks gestation of pregnancy: Secondary | ICD-10-CM | POA: Diagnosis not present

## 2013-12-30 DIAGNOSIS — O289 Unspecified abnormal findings on antenatal screening of mother: Secondary | ICD-10-CM | POA: Insufficient documentation

## 2013-12-30 DIAGNOSIS — Z36 Encounter for antenatal screening of mother: Secondary | ICD-10-CM | POA: Diagnosis not present

## 2013-12-30 DIAGNOSIS — Z3689 Encounter for other specified antenatal screening: Secondary | ICD-10-CM

## 2013-12-31 DIAGNOSIS — O359XX0 Maternal care for (suspected) fetal abnormality and damage, unspecified, not applicable or unspecified: Secondary | ICD-10-CM | POA: Insufficient documentation

## 2013-12-31 DIAGNOSIS — Z3689 Encounter for other specified antenatal screening: Secondary | ICD-10-CM | POA: Insufficient documentation

## 2013-12-31 DIAGNOSIS — Z3A23 23 weeks gestation of pregnancy: Secondary | ICD-10-CM | POA: Insufficient documentation

## 2013-12-31 DIAGNOSIS — O99212 Obesity complicating pregnancy, second trimester: Secondary | ICD-10-CM | POA: Insufficient documentation

## 2014-03-30 ENCOUNTER — Other Ambulatory Visit: Payer: Self-pay | Admitting: Obstetrics and Gynecology

## 2014-04-07 LAB — OB RESULTS CONSOLE GBS: GBS: POSITIVE

## 2014-04-28 ENCOUNTER — Encounter (HOSPITAL_COMMUNITY)
Admission: AD | Disposition: A | Discharge status: Home / Self Care | Payer: Self-pay | Source: Ambulatory Visit | Attending: Obstetrics and Gynecology

## 2014-04-28 ENCOUNTER — Inpatient Hospital Stay (HOSPITAL_COMMUNITY): Payer: BLUE CROSS/BLUE SHIELD | Admitting: Anesthesiology

## 2014-04-28 ENCOUNTER — Inpatient Hospital Stay (HOSPITAL_COMMUNITY)
Admission: AD | Admit: 2014-04-28 | Discharge: 2014-05-01 | DRG: 765 | Disposition: A | Discharge status: Home / Self Care | Payer: BLUE CROSS/BLUE SHIELD | Source: Ambulatory Visit | Attending: Obstetrics and Gynecology | Admitting: Obstetrics and Gynecology

## 2014-04-28 ENCOUNTER — Encounter (HOSPITAL_COMMUNITY): Payer: Self-pay | Admitting: *Deleted

## 2014-04-28 DIAGNOSIS — Z3403 Encounter for supervision of normal first pregnancy, third trimester: Secondary | ICD-10-CM | POA: Diagnosis present

## 2014-04-28 DIAGNOSIS — Z3A40 40 weeks gestation of pregnancy: Secondary | ICD-10-CM | POA: Diagnosis present

## 2014-04-28 DIAGNOSIS — O135 Gestational [pregnancy-induced] hypertension without significant proteinuria, complicating the puerperium: Secondary | ICD-10-CM | POA: Diagnosis present

## 2014-04-28 DIAGNOSIS — Z9889 Other specified postprocedural states: Secondary | ICD-10-CM

## 2014-04-28 DIAGNOSIS — O2653 Maternal hypotension syndrome, third trimester: Secondary | ICD-10-CM | POA: Diagnosis present

## 2014-04-28 DIAGNOSIS — O133 Gestational [pregnancy-induced] hypertension without significant proteinuria, third trimester: Secondary | ICD-10-CM

## 2014-04-28 DIAGNOSIS — O480 Post-term pregnancy: Secondary | ICD-10-CM | POA: Diagnosis present

## 2014-04-28 DIAGNOSIS — O139 Gestational [pregnancy-induced] hypertension without significant proteinuria, unspecified trimester: Secondary | ICD-10-CM | POA: Diagnosis present

## 2014-04-28 LAB — URINE MICROSCOPIC-ADD ON

## 2014-04-28 LAB — CBC
HCT: 37.2 % (ref 36.0–46.0)
HEMATOCRIT: 38.8 % (ref 36.0–46.0)
Hemoglobin: 13.4 g/dL (ref 12.0–15.0)
Hemoglobin: 12.7 g/dL (ref 12.0–15.0)
MCH: 30.3 pg (ref 26.0–34.0)
MCH: 30.1 pg (ref 26.0–34.0)
MCHC: 34.5 g/dL (ref 30.0–36.0)
MCHC: 34.1 g/dL (ref 30.0–36.0)
MCV: 87.8 fL (ref 78.0–100.0)
MCV: 88.2 fL (ref 78.0–100.0)
PLATELETS: 252 10*3/uL (ref 150–400)
Platelets: 265 10*3/uL (ref 150–400)
RBC: 4.42 MIL/uL (ref 3.87–5.11)
RBC: 4.22 MIL/uL (ref 3.87–5.11)
RDW: 13.7 % (ref 11.5–15.5)
RDW: 13.8 % (ref 11.5–15.5)
WBC: 11 10*3/uL — AB (ref 4.0–10.5)
WBC: 11.9 10*3/uL — AB (ref 4.0–10.5)

## 2014-04-28 LAB — URINALYSIS, ROUTINE W REFLEX MICROSCOPIC
BILIRUBIN URINE: NEGATIVE
Glucose, UA: NEGATIVE mg/dL
Hgb urine dipstick: NEGATIVE
KETONES UR: NEGATIVE mg/dL
Nitrite: NEGATIVE
PROTEIN: NEGATIVE mg/dL
Specific Gravity, Urine: <1.005 — ABNORMAL LOW (ref 1.005–1.030)
Urobilinogen, UA: 0.2 mg/dL (ref 0.0–1.0)
pH: 6 (ref 5.0–8.0)

## 2014-04-28 LAB — COMPREHENSIVE METABOLIC PANEL
ALT: 19 U/L (ref 0–35)
ANION GAP: 12 (ref 5–15)
AST: 21 U/L (ref 0–37)
Albumin: 3.1 g/dL — ABNORMAL LOW (ref 3.5–5.2)
Alkaline Phosphatase: 151 U/L — ABNORMAL HIGH (ref 39–117)
BUN: 9 mg/dL (ref 6–23)
CALCIUM: 8.9 mg/dL (ref 8.4–10.5)
CO2: 19 mmol/L (ref 19–32)
CREATININE: 0.76 mg/dL (ref 0.50–1.10)
Chloride: 106 mmol/L (ref 96–112)
GLUCOSE: 67 mg/dL — AB (ref 70–99)
Potassium: 4.3 mmol/L (ref 3.5–5.1)
Sodium: 137 mmol/L (ref 135–145)
Total Bilirubin: 0.2 mg/dL — ABNORMAL LOW (ref 0.3–1.2)
Total Protein: 6.1 g/dL (ref 6.0–8.3)

## 2014-04-28 LAB — TYPE AND SCREEN
ABO/RH(D): A POS
ANTIBODY SCREEN: NEGATIVE

## 2014-04-28 LAB — URIC ACID: Uric Acid, Serum: 5.7 mg/dL (ref 2.4–7.0)

## 2014-04-28 LAB — PROTEIN / CREATININE RATIO, URINE
CREATININE, URINE: 61 mg/dL
Protein Creatinine Ratio: 0.15 (ref 0.00–0.15)
Total Protein, Urine: 9 mg/dL

## 2014-04-28 LAB — ABO/RH: ABO/RH(D): A POS

## 2014-04-28 LAB — LACTATE DEHYDROGENASE: LDH: 198 U/L (ref 94–250)

## 2014-04-28 SURGERY — Surgical Case
Anesthesia: Epidural | Site: Abdomen

## 2014-04-28 MED ORDER — DEXTROSE 5 % IV SOLN
5.0000 10*6.[IU] | Freq: Once | INTRAVENOUS | Status: AC
  Administered 2014-04-28: 5 10*6.[IU] via INTRAVENOUS
  Filled 2014-04-28: qty 5

## 2014-04-28 MED ORDER — ONDANSETRON HCL 4 MG/2ML IJ SOLN
INTRAMUSCULAR | Status: AC
  Filled 2014-04-28: qty 2

## 2014-04-28 MED ORDER — MEPERIDINE HCL 25 MG/ML IJ SOLN: 6.2500 mg | INTRAMUSCULAR | Status: DC | PRN

## 2014-04-28 MED ORDER — MORPHINE SULFATE (PF) 0.5 MG/ML IJ SOLN
INTRAMUSCULAR | Status: DC | PRN
  Administered 2014-04-28: 3 mg via EPIDURAL

## 2014-04-28 MED ORDER — KETOROLAC TROMETHAMINE 30 MG/ML IJ SOLN: 30.0000 mg | Freq: Four times a day (QID) | INTRAMUSCULAR | Status: DC | PRN

## 2014-04-28 MED ORDER — ONDANSETRON HCL 4 MG/2ML IJ SOLN: 4.0000 mg | Freq: Four times a day (QID) | INTRAMUSCULAR | Status: DC | PRN

## 2014-04-28 MED ORDER — CITRIC ACID-SODIUM CITRATE 334-500 MG/5ML PO SOLN
30.0000 mL | ORAL | Status: DC | PRN
  Administered 2014-04-28: 30 mL via ORAL
  Filled 2014-04-28: qty 15

## 2014-04-28 MED ORDER — PENICILLIN G POTASSIUM 5000000 UNITS IJ SOLR
2.5000 10*6.[IU] | INTRAVENOUS | Status: DC
  Filled 2014-04-28 (×4): qty 2.5

## 2014-04-28 MED ORDER — FENTANYL CITRATE 0.05 MG/ML IJ SOLN
INTRAMUSCULAR | Status: AC
  Administered 2014-04-28: 50 ug via INTRAVENOUS
  Filled 2014-04-28: qty 2

## 2014-04-28 MED ORDER — PHENYLEPHRINE 40 MCG/ML (10ML) SYRINGE FOR IV PUSH (FOR BLOOD PRESSURE SUPPORT)
80.0000 ug | PREFILLED_SYRINGE | INTRAVENOUS | Status: DC | PRN
  Filled 2014-04-28: qty 20

## 2014-04-28 MED ORDER — ACETAMINOPHEN 160 MG/5ML PO SOLN: 325.0000 mg | ORAL | Status: DC | PRN

## 2014-04-28 MED ORDER — PHENYLEPHRINE HCL 10 MG/ML IJ SOLN
INTRAMUSCULAR | Status: DC | PRN
Start: 2014-04-28 — End: 2014-04-28
  Administered 2014-04-28 (×2): 40 ug via INTRAVENOUS

## 2014-04-28 MED ORDER — MIDAZOLAM HCL 2 MG/2ML IJ SOLN: 0.5000 mg | Freq: Once | INTRAMUSCULAR | Status: AC | PRN

## 2014-04-28 MED ORDER — BUTORPHANOL TARTRATE 1 MG/ML IJ SOLN: 1.0000 mg | INTRAMUSCULAR | Status: DC | PRN

## 2014-04-28 MED ORDER — CEFAZOLIN SODIUM-DEXTROSE 2-3 GM-% IV SOLR
INTRAVENOUS | Status: AC
  Filled 2014-04-28: qty 50

## 2014-04-28 MED ORDER — ONDANSETRON HCL 4 MG/2ML IJ SOLN
INTRAMUSCULAR | Status: DC | PRN
  Administered 2014-04-28: 4 mg via INTRAVENOUS

## 2014-04-28 MED ORDER — TERBUTALINE SULFATE 1 MG/ML IJ SOLN
0.2500 mg | Freq: Once | INTRAMUSCULAR | Status: AC | PRN
  Administered 2014-04-28: 0.25 mg via SUBCUTANEOUS
  Filled 2014-04-28: qty 1

## 2014-04-28 MED ORDER — LACTATED RINGERS IV SOLN
INTRAVENOUS | Status: DC | PRN
  Administered 2014-04-28 (×2): via INTRAVENOUS

## 2014-04-28 MED ORDER — ACETAMINOPHEN 325 MG PO TABS: 650.0000 mg | ORAL_TABLET | ORAL | Status: DC | PRN

## 2014-04-28 MED ORDER — PHENYLEPHRINE 40 MCG/ML (10ML) SYRINGE FOR IV PUSH (FOR BLOOD PRESSURE SUPPORT): 80.0000 ug | PREFILLED_SYRINGE | INTRAVENOUS | Status: DC | PRN

## 2014-04-28 MED ORDER — LACTATED RINGERS IV SOLN: 500.0000 mL | Freq: Once | INTRAVENOUS | Status: DC

## 2014-04-28 MED ORDER — SCOPOLAMINE 1 MG/3DAYS TD PT72: 1.0000 | MEDICATED_PATCH | Freq: Once | TRANSDERMAL | Status: DC

## 2014-04-28 MED ORDER — FENTANYL CITRATE 0.05 MG/ML IJ SOLN
25.0000 ug | INTRAMUSCULAR | Status: DC | PRN
  Administered 2014-04-28: 50 ug via INTRAVENOUS

## 2014-04-28 MED ORDER — OXYTOCIN 40 UNITS IN LACTATED RINGERS INFUSION - SIMPLE MED: 1.0000 m[IU]/min | INTRAVENOUS | Status: DC

## 2014-04-28 MED ORDER — LIDOCAINE HCL (PF) 1 % IJ SOLN: 30.0000 mL | INTRAMUSCULAR | Status: DC | PRN

## 2014-04-28 MED ORDER — LIDOCAINE-EPINEPHRINE (PF) 2 %-1:200000 IJ SOLN
INTRAMUSCULAR | Status: AC
  Filled 2014-04-28: qty 20

## 2014-04-28 MED ORDER — TERBUTALINE SULFATE 1 MG/ML IJ SOLN: 0.2500 mg | Freq: Once | INTRAMUSCULAR | Status: DC | PRN

## 2014-04-28 MED ORDER — LIDOCAINE HCL (PF) 1 % IJ SOLN
INTRAMUSCULAR | Status: DC | PRN
  Administered 2014-04-28 (×2): 5 mL

## 2014-04-28 MED ORDER — EPHEDRINE 5 MG/ML INJ: 10.0000 mg | INTRAVENOUS | Status: DC | PRN

## 2014-04-28 MED ORDER — PROMETHAZINE HCL 25 MG/ML IJ SOLN: 6.2500 mg | INTRAMUSCULAR | Status: DC | PRN

## 2014-04-28 MED ORDER — MORPHINE SULFATE 0.5 MG/ML IJ SOLN
INTRAMUSCULAR | Status: AC
  Filled 2014-04-28: qty 10

## 2014-04-28 MED ORDER — CEFAZOLIN SODIUM-DEXTROSE 2-3 GM-% IV SOLR
INTRAVENOUS | Status: DC | PRN
  Administered 2014-04-28: 2 g via INTRAVENOUS

## 2014-04-28 MED ORDER — DIPHENHYDRAMINE HCL 50 MG/ML IJ SOLN
12.5000 mg | INTRAMUSCULAR | Status: DC | PRN
Start: 2014-04-28 — End: 2014-04-29

## 2014-04-28 MED ORDER — OXYTOCIN 40 UNITS IN LACTATED RINGERS INFUSION - SIMPLE MED: 62.5000 mL/h | INTRAVENOUS | Status: DC

## 2014-04-28 MED ORDER — LACTATED RINGERS IV SOLN
INTRAVENOUS | Status: DC
  Administered 2014-04-28: 13:00:00 via INTRAVENOUS

## 2014-04-28 MED ORDER — LACTATED RINGERS IV SOLN: 500.0000 mL | INTRAVENOUS | Status: DC | PRN

## 2014-04-28 MED ORDER — OXYTOCIN 10 UNIT/ML IJ SOLN
40.0000 [IU] | INTRAMUSCULAR | Status: DC | PRN
  Administered 2014-04-28: 40 [IU] via INTRAVENOUS

## 2014-04-28 MED ORDER — LACTATED RINGERS IV SOLN
INTRAVENOUS | Status: DC | PRN
  Administered 2014-04-28 (×2): via INTRAVENOUS

## 2014-04-28 MED ORDER — SCOPOLAMINE 1 MG/3DAYS TD PT72
MEDICATED_PATCH | TRANSDERMAL | Status: AC
  Filled 2014-04-28: qty 1

## 2014-04-28 MED ORDER — SCOPOLAMINE 1 MG/3DAYS TD PT72
MEDICATED_PATCH | TRANSDERMAL | Status: DC | PRN
  Administered 2014-04-28: 1 via TRANSDERMAL

## 2014-04-28 MED ORDER — KETOROLAC TROMETHAMINE 30 MG/ML IJ SOLN
30.0000 mg | Freq: Four times a day (QID) | INTRAMUSCULAR | Status: DC | PRN
  Administered 2014-04-29: 30 mg via INTRAVENOUS

## 2014-04-28 MED ORDER — DIPHENHYDRAMINE HCL 50 MG/ML IJ SOLN: 12.5000 mg | INTRAMUSCULAR | Status: DC | PRN

## 2014-04-28 MED ORDER — OXYTOCIN BOLUS FROM INFUSION: 500.0000 mL | INTRAVENOUS | Status: DC

## 2014-04-28 MED ORDER — SODIUM BICARBONATE 8.4 % IV SOLN
INTRAVENOUS | Status: AC
  Filled 2014-04-28: qty 50

## 2014-04-28 MED ORDER — SODIUM BICARBONATE 8.4 % IV SOLN
INTRAVENOUS | Status: DC | PRN
  Administered 2014-04-28: 2 mL via EPIDURAL
  Administered 2014-04-28: 3 mL via EPIDURAL
  Administered 2014-04-28 (×3): 5 mL via EPIDURAL

## 2014-04-28 MED ORDER — KETOROLAC TROMETHAMINE 30 MG/ML IJ SOLN: 30.0000 mg | Freq: Once | INTRAMUSCULAR | Status: DC | PRN

## 2014-04-28 MED ORDER — ACETAMINOPHEN 325 MG PO TABS: 325.0000 mg | ORAL_TABLET | ORAL | Status: DC | PRN

## 2014-04-28 MED ORDER — OXYTOCIN 10 UNIT/ML IJ SOLN
INTRAMUSCULAR | Status: AC
  Filled 2014-04-28: qty 4

## 2014-04-28 MED ORDER — FENTANYL 2.5 MCG/ML BUPIVACAINE 1/10 % EPIDURAL INFUSION (WH - ANES)
14.0000 mL/h | INTRAMUSCULAR | Status: DC | PRN
  Administered 2014-04-28: 14 mL/h via EPIDURAL
  Filled 2014-04-28: qty 125

## 2014-04-28 MED ORDER — MISOPROSTOL 25 MCG QUARTER TABLET
25.0000 ug | ORAL_TABLET | ORAL | Status: DC | PRN
Start: 2014-04-28 — End: 2014-04-28
  Administered 2014-04-28 (×2): 25 ug via VAGINAL
  Filled 2014-04-28 (×2): qty 0.25

## 2014-04-28 MED ORDER — DIPHENHYDRAMINE HCL 25 MG PO CAPS
25.0000 mg | ORAL_CAPSULE | ORAL | Status: DC | PRN
  Filled 2014-04-28: qty 1

## 2014-04-28 SURGICAL SUPPLY — 33 items
APL SKNCLS STERI-STRIP NONHPOA (GAUZE/BANDAGES/DRESSINGS) ×1
BENZOIN TINCTURE PRP APPL 2/3 (GAUZE/BANDAGES/DRESSINGS) ×3 IMPLANT
CLAMP CORD UMBIL (MISCELLANEOUS) IMPLANT
CLOSURE WOUND 1/2 X4 (GAUZE/BANDAGES/DRESSINGS) ×1
CLOTH BEACON ORANGE TIMEOUT ST (SAFETY) ×3 IMPLANT
DRAPE SHEET LG 3/4 BI-LAMINATE (DRAPES) IMPLANT
DRSG OPSITE POSTOP 4X10 (GAUZE/BANDAGES/DRESSINGS) ×3 IMPLANT
DURAPREP 26ML APPLICATOR (WOUND CARE) ×3 IMPLANT
ELECT REM PT RETURN 9FT ADLT (ELECTROSURGICAL) ×3
ELECTRODE REM PT RTRN 9FT ADLT (ELECTROSURGICAL) ×1 IMPLANT
EXTRACTOR VACUUM BELL STYLE (SUCTIONS) IMPLANT
GLOVE BIO SURGEON STRL SZ7 (GLOVE) ×3 IMPLANT
GOWN STRL REUS W/TWL LRG LVL3 (GOWN DISPOSABLE) ×6 IMPLANT
KIT ABG SYR 3ML LUER SLIP (SYRINGE) IMPLANT
NDL HYPO 25X5/8 SAFETYGLIDE (NEEDLE) IMPLANT
NEEDLE HYPO 25X5/8 SAFETYGLIDE (NEEDLE) IMPLANT
NS IRRIG 1000ML POUR BTL (IV SOLUTION) ×3 IMPLANT
PACK C SECTION WH (CUSTOM PROCEDURE TRAY) ×3 IMPLANT
PAD OB MATERNITY 4.3X12.25 (PERSONAL CARE ITEMS) ×3 IMPLANT
RTRCTR C-SECT PINK 25CM LRG (MISCELLANEOUS) ×3 IMPLANT
STAPLER VISISTAT 35W (STAPLE) IMPLANT
STRIP CLOSURE SKIN 1/2X4 (GAUZE/BANDAGES/DRESSINGS) ×2 IMPLANT
SUT MNCRL 0 VIOLET CTX 36 (SUTURE) ×2 IMPLANT
SUT MONOCRYL 0 CTX 36 (SUTURE) ×6
SUT PDS AB 0 CTX 60 (SUTURE) IMPLANT
SUT PLAIN 2 0 XLH (SUTURE) ×2 IMPLANT
SUT VIC AB 0 CT1 27 (SUTURE) ×6
SUT VIC AB 0 CT1 27XBRD ANBCTR (SUTURE) ×2 IMPLANT
SUT VIC AB 2-0 CT1 27 (SUTURE) ×3
SUT VIC AB 2-0 CT1 TAPERPNT 27 (SUTURE) ×1 IMPLANT
SUT VIC AB 4-0 KS 27 (SUTURE) ×3 IMPLANT
TOWEL OR 17X24 6PK STRL BLUE (TOWEL DISPOSABLE) ×3 IMPLANT
TRAY FOLEY CATH 14FR (SET/KITS/TRAYS/PACK) ×3 IMPLANT

## 2014-04-28 NOTE — Brief Op Note (Signed)
04/28/2014  11:15 PM  PATIENT:  Nina Nguyen  24 y.o. female  PRE-OPERATIVE DIAGNOSIS:  nonreassuring fetal heart tones, persistent late decels remote from vaginal delivery  POST-OPERATIVE DIAGNOSIS:  same  PROCEDURE:  Procedure(s): CESAREAN SECTION (N/A)  SURGEON:  Surgeon(s) and Role:    * Loney LaurenceMichelle A Dwyne Hasegawa, MD - Primary  ANESTHESIA:   epidural  EBL:  Total I/O In: 2300 [I.V.:2300] Out: 700 [Urine:100; Blood:600]  SPECIMEN:  No Specimen  DISPOSITION OF SPECIMEN:  N/A  COUNTS:  YES  TOURNIQUET:  * No tourniquets in log *  DICTATION: .Note written in EPIC  PLAN OF CARE: Admit to inpatient   PATIENT DISPOSITION:  PACU - hemodynamically stable.   Delay start of Pharmacological VTE agent (>24hrs) due to surgical blood loss or risk of bleeding: not applicable  Complications:  None. Medications:  Ancef, Pitocin Findings:  Baby female, Apgars 8,9, cord gas 7.28, weight P.   Normal tubes, ovaries and uterus seen.  Baby was skin to skin with mother after birth in the OR.  Technique:  After adequate epidural anesthesia was achieved, the patient was prepped and draped in usual sterile fashion.  A foley catheter was used to drain the bladder.  A pfannanstiel incision was made with the scalpel and carried down to the fascia with the bovie cautery. The fascia was incised in the midline with the scalpel and carried in a transverse curvilinear manner bilaterally.  The fascia was reflected superiorly and inferiorly off the rectus muscles and the muscles split in the midline.  A bowel free portion of the peritoneum was entered bluntly and then extended in a superior and inferior manner with good visualization of the bowel and bladder.  The Alexis instrument was then placed and the vesico-uterine fascia tented up and incised in a transverse curvilinear manner.  A 2 cm transverse incision was made in the upper portion of the lower uterine segment until the amnion was exposed.   The incision  was extended transversely in a blunt manner.  Light meconium fluid was noted and the baby delivered in the vertex presentation without complication.  The baby was bulb suctioned and the cord was clamped and cut.  The baby was then handed to awaiting Neonatology.  The placenta was then delivered manually and the uterus cleared of all debris.  The uterine incision was then closed with a running lock stitch of 0 monocryl.  An imbricating layer of 0 monocryl was closed as well. Excellent hemostasis of the uterine incision was achieved and the abdomen was cleared with irrigation.  The peritoneum was closed with a running stitch of 2-0 vicryl.  This incorporated the rectus muscles as a separate layer.  The fascia was then closed with a running stitch of 0 vicryl.  The subcutaneous layer was closed with interrupted  stitches of 2-0 plain gut.  The skin was closed with 4-0 vicryl on a Keith needle and steri-strips.  The patient tolerated the procedure well and was returned to the recovery room in stable condition.  All counts were correct times three.  Alicia Seib A

## 2014-04-28 NOTE — Progress Notes (Signed)
Pt s/p epidural and still having repetitive lates.    D/w pt risks, benefits and alt of LTCS with non-reassuring very remote from vaginal delivery- she agrees to proceed.

## 2014-04-28 NOTE — Transfer of Care (Signed)
Immediate Anesthesia Transfer of Care Note  Patient: Nina JunesKelsey H Ryther  Procedure(s) Performed: Procedure(s): CESAREAN SECTION (N/A)  Patient Location: PACU  Anesthesia Type:Epidural  Level of Consciousness: awake, alert , oriented and patient cooperative  Airway & Oxygen Therapy: Patient Spontanous Breathing  Post-op Assessment: Report given to RN and Post -op Vital signs reviewed and stable  Post vital signs: Reviewed and stable  Last Vitals: 111/58, HR 68, R 15, Temp 97.6   Complications: No apparent anesthesia complications

## 2014-04-28 NOTE — H&P (Signed)
24 y.o. [redacted]w[redacted]d  G1P0000 comes in for PIH.  Otherwise has good fetal movement and no bleeding.  No s/s of preeclampsia.   Past Medical History  Diagnosis Date  . Chicken pox as a child  . Vaginitis 02/02/2011  . Medical history non-contributory     Past Surgical History  Procedure Laterality Date  . Cholecystectomy  24 yrs old  . No past surgeries      OB History  Gravida Para Term Preterm AB SAB TAB Ectopic Multiple Living     # Outcome Date GA Lbr Len/2nd Weight Sex Delivery Anes PTL Lv  1 Current               History   Social History  . Marital Status: Single    Spouse Name: N/A  . Number of Children: N/A  . Years of Education: N/A   Occupational History  . Not on file.   Social History Main Topics  . Smoking status: Never Smoker   . Smokeless tobacco: Never Used  . Alcohol Use: No     Comment: occasional, light  . Drug Use: No  . Sexual Activity:    Partners: Male   Other Topics Concern  . Not on file   Social History Narrative   Codeine    Prenatal Transfer Tool  Maternal Diabetes: No Genetic Screening: Normal Maternal Ultrasounds/Referrals: Normal Fetal Ultrasounds or other Referrals:  None Maternal Substance Abuse:  No Significant Maternal Medications:  None Significant Maternal Lab Results: None  Other PNC: uncomplicated.    Filed Vitals:   04/28/14 1515  BP: 158/111  Pulse: 96  Temp:   Resp:      Lungs/Cor:  NAD Abdomen:  soft, gravid Ex:  no cords, erythema SVE:  Cl/th/high FHTs:  130s, good STV, NST R Toco:  qocc  Results for orders placed or performed during the hospital encounter of 04/28/14 (from the past 24 hour(s))  Urinalysis, Routine w reflex microscopic     Status: Abnormal   Collection Time: 04/28/14 11:48 AM  Result Value Ref Range   Color, Urine YELLOW YELLOW   APPearance HAZY (A) CLEAR   Specific Gravity, Urine <1.005 (L) 1.005 - 1.030   pH 6.0 5.0 - 8.0   Glucose, UA NEGATIVE NEGATIVE mg/dL    Hgb urine dipstick NEGATIVE NEGATIVE   Bilirubin Urine NEGATIVE NEGATIVE   Ketones, ur NEGATIVE NEGATIVE mg/dL   Protein, ur NEGATIVE NEGATIVE mg/dL   Urobilinogen, UA 0.2 0.0 - 1.0 mg/dL   Nitrite NEGATIVE NEGATIVE   Leukocytes, UA TRACE (A) NEGATIVE  Protein / creatinine ratio, urine     Status: None   Collection Time: 04/28/14 11:48 AM  Result Value Ref Range   Creatinine, Urine 61.00 mg/dL   Total Protein, Urine 9 mg/dL   Protein Creatinine Ratio 0.15 0.00 - 0.15  Urine microscopic-add on     Status: Abnormal   Collection Time: 04/28/14 11:48 AM  Result Value Ref Range   Squamous Epithelial / LPF FEW (A) RARE   WBC, UA 0-2 <3 WBC/hpf   RBC / HPF 3-6 <3 RBC/hpf   Bacteria, UA MANY (A) RARE  Comprehensive metabolic panel     Status: Abnormal   Collection Time: 04/28/14 12:40 PM  Result Value Ref Range   Sodium 137 135 - 145 mmol/L   Potassium 4.3 3.5 - 5.1 mmol/L   Chloride 106 96 - 112 mmol/L   CO2 19 19 -  32 mmol/L   Glucose, Bld 67 (L) 70 - 99 mg/dL   BUN 9 6 - 23 mg/dL   Creatinine, Ser 5.400.76 0.50 - 1.10 mg/dL   Calcium 8.9 8.4 - 98.110.5 mg/dL   Total Protein 6.1 6.0 - 8.3 g/dL   Albumin 3.1 (L) 3.5 - 5.2 g/dL   AST 21 0 - 37 U/L   ALT 19 0 - 35 U/L   Alkaline Phosphatase 151 (H) 39 - 117 U/L   Total Bilirubin 0.2 (L) 0.3 - 1.2 mg/dL   GFR calc non Af Amer >90 >90 mL/min   GFR calc Af Amer >90 >90 mL/min   Anion gap 12 5 - 15  Uric acid     Status: None   Collection Time: 04/28/14 12:40 PM  Result Value Ref Range   Uric Acid, Serum 5.7 2.4 - 7.0 mg/dL  Lactate dehydrogenase     Status: None   Collection Time: 04/28/14 12:40 PM  Result Value Ref Range   LDH 198 94 - 250 U/L  Type and screen     Status: None   Collection Time: 04/28/14 12:40 PM  Result Value Ref Range   ABO/RH(D) A POS    Antibody Screen NEG    Sample Expiration 05/01/2014   CBC     Status: Abnormal   Collection Time: 04/28/14 12:40 PM  Result Value Ref Range   WBC 11.0 (H) 4.0 - 10.5 K/uL    RBC 4.42 3.87 - 5.11 MIL/uL   Hemoglobin 13.4 12.0 - 15.0 g/dL   HCT 19.138.8 47.836.0 - 29.546.0 %   MCV 87.8 78.0 - 100.0 fL   MCH 30.3 26.0 - 34.0 pg   MCHC 34.5 30.0 - 36.0 g/dL   RDW 62.113.7 30.811.5 - 65.715.5 %   Platelets 265 150 - 400 K/uL  ABO/Rh     Status: None   Collection Time: 04/28/14 12:40 PM  Result Value Ref Range   ABO/RH(D) A POS    A/P   Pt at 40 1/7 with new onset PIH. No s/s of severe but BPs increased a lot.  For induction with cytotec.  GBS POS- PCN.  Nina Nguyen A

## 2014-04-28 NOTE — Op Note (Signed)
04/28/2014  11:15 PM  PATIENT:  Nina Nguyen  24 y.o. female  PRE-OPERATIVE DIAGNOSIS:  nonreassuring fetal heart tones, persistent late decels remote from vaginal delivery  POST-OPERATIVE DIAGNOSIS:  same  PROCEDURE:  Procedure(s): CESAREAN SECTION (N/A)  SURGEON:  Surgeon(s) and Role:    * Kolbee Bogusz A Darren Nodal, MD - Primary  ANESTHESIA:   epidural  EBL:  Total I/O In: 2300 [I.V.:2300] Out: 700 [Urine:100; Blood:600]  SPECIMEN:  No Specimen  DISPOSITION OF SPECIMEN:  N/A  COUNTS:  YES  TOURNIQUET:  * No tourniquets in log *  DICTATION: .Note written in EPIC  PLAN OF CARE: Admit to inpatient   PATIENT DISPOSITION:  PACU - hemodynamically stable.   Delay start of Pharmacological VTE agent (>24hrs) due to surgical blood loss or risk of bleeding: not applicable  Complications:  None. Medications:  Ancef, Pitocin Findings:  Baby female, Apgars 8,9, cord gas 7.28, weight P.   Normal tubes, ovaries and uterus seen.  Baby was skin to skin with mother after birth in the OR.  Technique:  After adequate epidural anesthesia was achieved, the patient was prepped and draped in usual sterile fashion.  A foley catheter was used to drain the bladder.  A pfannanstiel incision was made with the scalpel and carried down to the fascia with the bovie cautery. The fascia was incised in the midline with the scalpel and carried in a transverse curvilinear manner bilaterally.  The fascia was reflected superiorly and inferiorly off the rectus muscles and the muscles split in the midline.  A bowel free portion of the peritoneum was entered bluntly and then extended in a superior and inferior manner with good visualization of the bowel and bladder.  The Alexis instrument was then placed and the vesico-uterine fascia tented up and incised in a transverse curvilinear manner.  A 2 cm transverse incision was made in the upper portion of the lower uterine segment until the amnion was exposed.   The incision  was extended transversely in a blunt manner.  Light meconium fluid was noted and the baby delivered in the vertex presentation without complication.  The baby was bulb suctioned and the cord was clamped and cut.  The baby was then handed to awaiting Neonatology.  The placenta was then delivered manually and the uterus cleared of all debris.  The uterine incision was then closed with a running lock stitch of 0 monocryl.  An imbricating layer of 0 monocryl was closed as well. Excellent hemostasis of the uterine incision was achieved and the abdomen was cleared with irrigation.  The peritoneum was closed with a running stitch of 2-0 vicryl.  This incorporated the rectus muscles as a separate layer.  The fascia was then closed with a running stitch of 0 vicryl.  The subcutaneous layer was closed with interrupted  stitches of 2-0 plain gut.  The skin was closed with 4-0 vicryl on a Keith needle and steri-strips.  The patient tolerated the procedure well and was returned to the recovery room in stable condition.  All counts were correct times three.  Terisha Losasso A   

## 2014-04-28 NOTE — Anesthesia Procedure Notes (Addendum)
Epidural Patient location during procedure: OB Start time: 04/28/2014 9:53 PM  Staffing Anesthesiologist: Brayton CavesJACKSON, FREEMAN Performed by: anesthesiologist   Preanesthetic Checklist Completed: patient identified, site marked, surgical consent, pre-op evaluation, timeout performed, IV checked, risks and benefits discussed and monitors and equipment checked  Epidural Patient position: sitting Prep: site prepped and draped and DuraPrep Patient monitoring: continuous pulse ox and blood pressure Approach: midline Location: L3-L4 Injection technique: LOR air  Needle:  Needle type: Tuohy  Needle gauge: 17 G Needle length: 9 cm and 9 Needle insertion depth: 7 cm Catheter type: closed end flexible Catheter size: 19 Gauge Catheter at skin depth: 12 cm Test dose: negative  Assessment Events: blood not aspirated, injection not painful, no injection resistance, negative IV test and no paresthesia  Additional Notes Patient identified.  Risk benefits discussed including failed block, incomplete pain control, headache, nerve damage, paralysis, blood pressure changes, nausea, vomiting, reactions to medication both toxic or allergic, and postpartum back pain.  Patient expressed understanding and wished to proceed.  All questions were answered.  Sterile technique used throughout procedure and epidural site dressed with sterile barrier dressing. No paresthesia or other complications noted.The patient did not experience any signs of intravascular injection such as tinnitus or metallic taste in mouth nor signs of intrathecal spread such as rapid motor block. Please see nursing notes for vital signs.

## 2014-04-28 NOTE — Progress Notes (Signed)
FHTs 140s gSTV, NST R; starting at 19:30 some lates have developed that occ were more variable.  Toco q 3 (cytotec only)  SVE 1/60/-2, AROM light mec, IUPC placed.  Lates have resolved after IUPC.  Will watch and recheck.

## 2014-04-28 NOTE — Anesthesia Preprocedure Evaluation (Signed)
Anesthesia Evaluation  Patient identified by MRN, date of birth, ID band Patient awake    Reviewed: Allergy & Precautions, H&P , Patient's Chart, lab work & pertinent test results  Airway Mallampati: III TM Distance: >3 FB Neck ROM: full    Dental   Pulmonary  breath sounds clear to auscultation        Cardiovascular hypertension, Rhythm:regular Rate:Normal     Neuro/Psych    GI/Hepatic   Endo/Other  Morbid obesity  Renal/GU      Musculoskeletal   Abdominal   Peds  Hematology   Anesthesia Other Findings   Reproductive/Obstetrics (+) Pregnancy                           Anesthesia Physical Anesthesia Plan  ASA: III  Anesthesia Plan: Epidural   Post-op Pain Management:    Induction:   Airway Management Planned:   Additional Equipment:   Intra-op Plan:   Post-operative Plan:   Informed Consent: I have reviewed the patients History and Physical, chart, labs and discussed the procedure including the risks, benefits and alternatives for the proposed anesthesia with the patient or authorized representative who has indicated his/her understanding and acceptance.     Plan Discussed with:   Anesthesia Plan Comments:         Anesthesia Quick Evaluation  

## 2014-04-28 NOTE — MAU Note (Signed)
[facility]. Denies any vaginal bleeding or LOF

## 2014-04-29 ENCOUNTER — Encounter (HOSPITAL_COMMUNITY): Payer: Self-pay

## 2014-04-29 DIAGNOSIS — O135 Gestational [pregnancy-induced] hypertension without significant proteinuria, complicating the puerperium: Secondary | ICD-10-CM | POA: Diagnosis present

## 2014-04-29 DIAGNOSIS — Z9889 Other specified postprocedural states: Secondary | ICD-10-CM

## 2014-04-29 LAB — CBC
HCT: 23.4 % — ABNORMAL LOW (ref 36.0–46.0)
HCT: 25.1 % — ABNORMAL LOW (ref 36.0–46.0)
HCT: 26.2 % — ABNORMAL LOW (ref 36.0–46.0)
HCT: 33.2 % — ABNORMAL LOW (ref 36.0–46.0)
HEMOGLOBIN: 7.9 g/dL — AB (ref 12.0–15.0)
HEMOGLOBIN: 11.4 g/dL — AB (ref 12.0–15.0)
Hemoglobin: 8.7 g/dL — ABNORMAL LOW (ref 12.0–15.0)
Hemoglobin: 8.9 g/dL — ABNORMAL LOW (ref 12.0–15.0)
MCH: 30.6 pg (ref 26.0–34.0)
MCH: 30.3 pg (ref 26.0–34.0)
MCH: 30.3 pg (ref 26.0–34.0)
MCH: 30.4 pg (ref 26.0–34.0)
MCHC: 34.7 g/dL (ref 30.0–36.0)
MCHC: 34 g/dL (ref 30.0–36.0)
MCHC: 33.8 g/dL (ref 30.0–36.0)
MCHC: 34.3 g/dL (ref 30.0–36.0)
MCV: 88.4 fL (ref 78.0–100.0)
MCV: 89.1 fL (ref 78.0–100.0)
MCV: 89.7 fL (ref 78.0–100.0)
MCV: 88.5 fL (ref 78.0–100.0)
PLATELETS: 221 10*3/uL (ref 150–400)
Platelets: 208 10*3/uL (ref 150–400)
Platelets: 212 10*3/uL (ref 150–400)
Platelets: 246 10*3/uL (ref 150–400)
RBC: 2.84 MIL/uL — ABNORMAL LOW (ref 3.87–5.11)
RBC: 2.94 MIL/uL — ABNORMAL LOW (ref 3.87–5.11)
RBC: 2.61 MIL/uL — AB (ref 3.87–5.11)
RBC: 3.75 MIL/uL — AB (ref 3.87–5.11)
RDW: 13.9 % (ref 11.5–15.5)
RDW: 14 % (ref 11.5–15.5)
RDW: 14 % (ref 11.5–15.5)
RDW: 14.1 % (ref 11.5–15.5)
WBC: 11.6 10*3/uL — ABNORMAL HIGH (ref 4.0–10.5)
WBC: 13.5 10*3/uL — ABNORMAL HIGH (ref 4.0–10.5)
WBC: 17.4 10*3/uL — ABNORMAL HIGH (ref 4.0–10.5)
WBC: 9.9 10*3/uL (ref 4.0–10.5)

## 2014-04-29 LAB — COMPREHENSIVE METABOLIC PANEL
ALBUMIN: 2 g/dL — AB (ref 3.5–5.2)
ALK PHOS: 89 U/L (ref 39–117)
ALT: 14 U/L (ref 0–35)
AST: 20 U/L (ref 0–37)
Anion gap: 9 (ref 5–15)
BUN: 9 mg/dL (ref 6–23)
CHLORIDE: 108 mmol/L (ref 96–112)
CO2: 20 mmol/L (ref 19–32)
Calcium: 7.7 mg/dL — ABNORMAL LOW (ref 8.4–10.5)
Creatinine, Ser: 0.8 mg/dL (ref 0.50–1.10)
GFR calc Af Amer: >90 mL/min (ref >90)
GFR calc non Af Amer: >90 mL/min (ref >90)
GLUCOSE: 142 mg/dL — AB (ref 70–99)
Potassium: 3.8 mmol/L (ref 3.5–5.1)
SODIUM: 137 mmol/L (ref 135–145)
TOTAL PROTEIN: 3.9 g/dL — AB (ref 6.0–8.3)
Total Bilirubin: 0.4 mg/dL (ref 0.3–1.2)

## 2014-04-29 LAB — BASIC METABOLIC PANEL
ANION GAP: 7 (ref 5–15)
BUN: 11 mg/dL (ref 6–23)
CHLORIDE: 105 mmol/L (ref 96–112)
CO2: 25 mmol/L (ref 19–32)
Calcium: 7.9 mg/dL — ABNORMAL LOW (ref 8.4–10.5)
Creatinine, Ser: 0.82 mg/dL (ref 0.50–1.10)
GFR calc Af Amer: >90 mL/min (ref >90)
GFR calc non Af Amer: >90 mL/min (ref >90)
Glucose, Bld: 98 mg/dL (ref 70–99)
Potassium: 4.7 mmol/L (ref 3.5–5.1)
SODIUM: 137 mmol/L (ref 135–145)

## 2014-04-29 LAB — RPR: RPR: NONREACTIVE

## 2014-04-29 MED ORDER — PHENYLEPHRINE HCL 10 MG/ML IJ SOLN
0.0000 ug/min | INTRAMUSCULAR | Status: DC
  Administered 2014-04-29: 40 ug/min via INTRAVENOUS
  Filled 2014-04-29: qty 1

## 2014-04-29 MED ORDER — ONDANSETRON HCL 4 MG PO TABS: 4.0000 mg | ORAL_TABLET | ORAL | Status: DC | PRN

## 2014-04-29 MED ORDER — SIMETHICONE 80 MG PO CHEW
80.0000 mg | CHEWABLE_TABLET | ORAL | Status: DC
  Administered 2014-04-29 – 2014-05-01 (×2): 80 mg via ORAL
  Filled 2014-04-29 (×2): qty 1

## 2014-04-29 MED ORDER — MENTHOL 3 MG MT LOZG: 1.0000 | LOZENGE | OROMUCOSAL | Status: DC | PRN

## 2014-04-29 MED ORDER — PRENATAL MULTIVITAMIN CH
1.0000 | ORAL_TABLET | Freq: Every day | ORAL | Status: DC
  Administered 2014-04-29 – 2014-05-01 (×3): 1 via ORAL
  Filled 2014-04-29 (×3): qty 1

## 2014-04-29 MED ORDER — ACETAMINOPHEN 500 MG PO TABS
1000.0000 mg | ORAL_TABLET | Freq: Four times a day (QID) | ORAL | Status: DC | PRN
  Administered 2014-04-29: 1000 mg via ORAL
  Filled 2014-04-29: qty 2

## 2014-04-29 MED ORDER — FERROUS SULFATE 325 (65 FE) MG PO TABS: 325.0000 mg | ORAL_TABLET | Freq: Two times a day (BID) | ORAL | Status: DC

## 2014-04-29 MED ORDER — SIMETHICONE 80 MG PO CHEW
80.0000 mg | CHEWABLE_TABLET | Freq: Three times a day (TID) | ORAL | Status: DC
  Administered 2014-04-29 – 2014-05-01 (×4): 80 mg via ORAL
  Filled 2014-04-29 (×5): qty 1

## 2014-04-29 MED ORDER — LANOLIN HYDROUS EX OINT: 1.0000 "application " | TOPICAL_OINTMENT | CUTANEOUS | Status: DC | PRN

## 2014-04-29 MED ORDER — DIBUCAINE 1 % RE OINT: 1.0000 "application " | TOPICAL_OINTMENT | RECTAL | Status: DC | PRN

## 2014-04-29 MED ORDER — OXYCODONE-ACETAMINOPHEN 5-325 MG PO TABS
1.0000 | ORAL_TABLET | ORAL | Status: DC | PRN
  Administered 2014-04-29 – 2014-05-01 (×4): 1 via ORAL
  Filled 2014-04-29 (×4): qty 1

## 2014-04-29 MED ORDER — NALBUPHINE HCL 10 MG/ML IJ SOLN: 5.0000 mg | Freq: Once | INTRAMUSCULAR | Status: DC | PRN

## 2014-04-29 MED ORDER — MEASLES, MUMPS & RUBELLA VAC ~~LOC~~ INJ
0.5000 mL | INJECTION | Freq: Once | SUBCUTANEOUS | Status: DC
  Filled 2014-04-29: qty 0.5

## 2014-04-29 MED ORDER — SIMETHICONE 80 MG PO CHEW
80.0000 mg | CHEWABLE_TABLET | ORAL | Status: DC | PRN
Start: 2014-04-29 — End: 2014-04-29

## 2014-04-29 MED ORDER — OXYTOCIN 40 UNITS IN LACTATED RINGERS INFUSION - SIMPLE MED: 62.5000 mL/h | INTRAVENOUS | Status: AC

## 2014-04-29 MED ORDER — LACTATED RINGERS IV BOLUS (SEPSIS)
500.0000 mL | Freq: Once | INTRAVENOUS | Status: AC
Start: 2014-04-29 — End: 2014-04-29
  Administered 2014-04-29: 500 mL via INTRAVENOUS

## 2014-04-29 MED ORDER — BISACODYL 10 MG RE SUPP: 10.0000 mg | Freq: Every day | RECTAL | Status: DC | PRN

## 2014-04-29 MED ORDER — FLEET ENEMA 7-19 GM/118ML RE ENEM: 1.0000 | ENEMA | Freq: Every day | RECTAL | Status: DC | PRN

## 2014-04-29 MED ORDER — DIBUCAINE 1 % RE OINT
1.0000 | TOPICAL_OINTMENT | RECTAL | Status: DC | PRN
Start: 2014-04-29 — End: 2014-04-29

## 2014-04-29 MED ORDER — ZOLPIDEM TARTRATE 5 MG PO TABS: 5.0000 mg | ORAL_TABLET | Freq: Every evening | ORAL | Status: DC | PRN

## 2014-04-29 MED ORDER — NALOXONE HCL 1 MG/ML IJ SOLN
1.0000 ug/kg/h | INTRAVENOUS | Status: DC | PRN
  Filled 2014-04-29: qty 2

## 2014-04-29 MED ORDER — TETANUS-DIPHTH-ACELL PERTUSSIS 5-2.5-18.5 LF-MCG/0.5 IM SUSP
0.5000 mL | Freq: Once | INTRAMUSCULAR | Status: DC
  Filled 2014-04-29: qty 0.5

## 2014-04-29 MED ORDER — PRENATAL MULTIVITAMIN CH: 1.0000 | ORAL_TABLET | Freq: Every day | ORAL | Status: DC

## 2014-04-29 MED ORDER — METHYLERGONOVINE MALEATE 0.2 MG PO TABS
0.2000 mg | ORAL_TABLET | ORAL | Status: DC | PRN
Start: 2014-04-29 — End: 2014-04-29

## 2014-04-29 MED ORDER — WITCH HAZEL-GLYCERIN EX PADS: 1.0000 "application " | MEDICATED_PAD | CUTANEOUS | Status: DC | PRN

## 2014-04-29 MED ORDER — OXYTOCIN 40 UNITS IN LACTATED RINGERS INFUSION - SIMPLE MED: 62.5000 mL/h | INTRAVENOUS | Status: DC

## 2014-04-29 MED ORDER — OXYCODONE-ACETAMINOPHEN 5-325 MG PO TABS: 1.0000 | ORAL_TABLET | ORAL | Status: DC | PRN

## 2014-04-29 MED ORDER — NALBUPHINE HCL 10 MG/ML IJ SOLN: 5.0000 mg | INTRAMUSCULAR | Status: DC | PRN

## 2014-04-29 MED ORDER — METHYLERGONOVINE MALEATE 0.2 MG/ML IJ SOLN: 0.2000 mg | INTRAMUSCULAR | Status: DC | PRN

## 2014-04-29 MED ORDER — LORAZEPAM 2 MG/ML IJ SOLN
0.5000 mg | Freq: Once | INTRAMUSCULAR | Status: AC
  Administered 2014-04-29: 0.5 mg via INTRAVENOUS
  Filled 2014-04-29: qty 0.25

## 2014-04-29 MED ORDER — ONDANSETRON HCL 4 MG/2ML IJ SOLN: 4.0000 mg | INTRAMUSCULAR | Status: DC | PRN

## 2014-04-29 MED ORDER — METHYLERGONOVINE MALEATE 0.2 MG PO TABS: 0.2000 mg | ORAL_TABLET | ORAL | Status: DC | PRN

## 2014-04-29 MED ORDER — LACTATED RINGERS IV BOLUS (SEPSIS)
1000.0000 mL | Freq: Once | INTRAVENOUS | Status: AC
  Administered 2014-04-29: 1000 mL via INTRAVENOUS

## 2014-04-29 MED ORDER — NALOXONE HCL 0.4 MG/ML IJ SOLN: 0.4000 mg | INTRAMUSCULAR | Status: DC | PRN

## 2014-04-29 MED ORDER — OXYCODONE-ACETAMINOPHEN 5-325 MG PO TABS: 2.0000 | ORAL_TABLET | ORAL | Status: DC | PRN

## 2014-04-29 MED ORDER — DIPHENHYDRAMINE HCL 25 MG PO CAPS: 25.0000 mg | ORAL_CAPSULE | Freq: Four times a day (QID) | ORAL | Status: DC | PRN

## 2014-04-29 MED ORDER — IBUPROFEN 600 MG PO TABS
600.0000 mg | ORAL_TABLET | Freq: Four times a day (QID) | ORAL | Status: DC
  Administered 2014-04-29 – 2014-05-01 (×9): 600 mg via ORAL
  Filled 2014-04-29 (×9): qty 1

## 2014-04-29 MED ORDER — SIMETHICONE 80 MG PO CHEW: 80.0000 mg | CHEWABLE_TABLET | Freq: Three times a day (TID) | ORAL | Status: DC

## 2014-04-29 MED ORDER — ZOLPIDEM TARTRATE 5 MG PO TABS
5.0000 mg | ORAL_TABLET | Freq: Every evening | ORAL | Status: DC | PRN
  Administered 2014-04-30: 5 mg via ORAL
  Filled 2014-04-29: qty 1

## 2014-04-29 MED ORDER — IBUPROFEN 600 MG PO TABS: 600.0000 mg | ORAL_TABLET | Freq: Four times a day (QID) | ORAL | Status: DC

## 2014-04-29 MED ORDER — ACETAMINOPHEN 500 MG PO TABS
1000.0000 mg | ORAL_TABLET | Freq: Four times a day (QID) | ORAL | Status: DC
Start: 2014-04-29 — End: 2014-04-29

## 2014-04-29 MED ORDER — SENNOSIDES-DOCUSATE SODIUM 8.6-50 MG PO TABS: 2.0000 | ORAL_TABLET | ORAL | Status: DC

## 2014-04-29 MED ORDER — KETOROLAC TROMETHAMINE 30 MG/ML IJ SOLN
INTRAMUSCULAR | Status: AC
  Administered 2014-04-29: 30 mg via INTRAVENOUS
  Filled 2014-04-29: qty 1

## 2014-04-29 MED ORDER — ONDANSETRON HCL 4 MG/2ML IJ SOLN: 4.0000 mg | Freq: Three times a day (TID) | INTRAMUSCULAR | Status: DC | PRN

## 2014-04-29 MED ORDER — LACTATED RINGERS IV SOLN
INTRAVENOUS | Status: DC
  Administered 2014-04-29 (×5): via INTRAVENOUS

## 2014-04-29 MED ORDER — LACTATED RINGERS IV SOLN: INTRAVENOUS | Status: DC

## 2014-04-29 MED ORDER — SODIUM CHLORIDE 0.9 % IJ SOLN: 3.0000 mL | INTRAMUSCULAR | Status: DC | PRN

## 2014-04-29 MED ORDER — SENNOSIDES-DOCUSATE SODIUM 8.6-50 MG PO TABS
2.0000 | ORAL_TABLET | ORAL | Status: DC
  Administered 2014-04-29 – 2014-05-01 (×2): 2 via ORAL
  Filled 2014-04-29 (×2): qty 2

## 2014-04-29 MED ORDER — MEASLES, MUMPS & RUBELLA VAC ~~LOC~~ INJ: 0.5000 mL | INJECTION | Freq: Once | SUBCUTANEOUS | Status: DC

## 2014-04-29 MED ORDER — IBUPROFEN 600 MG PO TABS: 600.0000 mg | ORAL_TABLET | Freq: Four times a day (QID) | ORAL | Status: DC | PRN

## 2014-04-29 MED ORDER — SIMETHICONE 80 MG PO CHEW: 80.0000 mg | CHEWABLE_TABLET | ORAL | Status: DC

## 2014-04-29 MED ORDER — LORAZEPAM 1 MG PO TABS
0.5000 mg | ORAL_TABLET | Freq: Once | ORAL | Status: AC
  Administered 2014-04-29: 0.5 mg via ORAL
  Filled 2014-04-29: qty 1

## 2014-04-29 MED ORDER — SIMETHICONE 80 MG PO CHEW: 80.0000 mg | CHEWABLE_TABLET | ORAL | Status: DC | PRN

## 2014-04-29 MED ORDER — FERROUS SULFATE 325 (65 FE) MG PO TABS
325.0000 mg | ORAL_TABLET | Freq: Two times a day (BID) | ORAL | Status: DC
  Administered 2014-04-29 – 2014-05-01 (×4): 325 mg via ORAL
  Filled 2014-04-29 (×5): qty 1

## 2014-04-29 NOTE — Lactation Note (Addendum)
This note was copied from the chart of Boy Otilio SaberKelsey Tufte. Lactation Consultation Note  Patient Name: Boy Otilio SaberKelsey Mohammed NWGNF'AToday's Date: 04/29/2014 Reason for consult: Initial assessment;NICU baby NICU baby 12 hours of life. Mom states that she was able to nurse baby twice prior to baby being moved to NICU, and baby latched well both times. Mom's breasts are small and tubular shaped. Set mom up with DEBP and started her pumping. Reviewed parts, use, and cleaning with mom and her mother who states that she will be at the bedside to assist mom with pumping. Enc MOB to pump every 3 hours for 15 minutes and then post-pump. MOB states that she knows how to hand express, but has family in the room and just wants to pump for now. MOB is not sure if she has colostrum flowing yet. Enc mom to hand express after pumping and collect EBM in small bottles for baby in NICU. Mom given NICU booklet and aware of pumping log. Mom states that she has a DEBP at home.   Maternal Data Has patient been taught Hand Expression?: Yes (per mom.) Does the patient have breastfeeding experience prior to this delivery?: No  Feeding    LATCH Score/Interventions                      Lactation Tools Discussed/Used Pump Review: Setup, frequency, and cleaning;Milk Storage Initiated by:: JW Date initiated:: 04/29/14   Consult Status Consult Status: Follow-up Date: 04/29/14 Follow-up type: In-patient    Geralynn OchsWILLIARD, Nikki Glanzer 04/29/2014, 11:07 AM

## 2014-04-29 NOTE — Significant Event (Signed)
Rapid Response Event Note  Overview: Time Called: 0238 Arrival Time: 0240 Event Type: Hypotension  Initial Focused Assessment:   Interventions:   Event Summary: Name of Physician Notified: Dr. Henderson CloudHorvath at [phone]  Name of Consulting Physician Notified: Dr. Brayton CavesFreeman Jackson at [phone]  Outcome: Transferred (Comment)  Event End Time: 0320  Jeannine KittenKeys, Kendi Defalco Ball

## 2014-04-29 NOTE — Anesthesia Postprocedure Evaluation (Signed)
Anesthesia Post Note  Patient: Nina Nguyen  Procedure(s) Performed: Procedure(s) (LRB): CESAREAN SECTION (N/A)  Anesthesia type: Epidural  Patient location: Mother/Baby  Post pain: Pain level controlled  Post assessment: Post-op Vital signs reviewed  Last Vitals:  Filed Vitals:   04/29/14 0700  BP: 128/76  Pulse: 81  Temp:   Resp: 18    Post vital signs: Reviewed  Level of consciousness: awake  Complications: No apparent anesthesia complications

## 2014-04-29 NOTE — Progress Notes (Signed)
UR chart review completed.  

## 2014-04-29 NOTE — Progress Notes (Signed)
0.5mg  IV Ativan given to pt per order Dr. Henderson CloudHorvath.  1.5mg  IV wasted in sink and witnessed by Felipa EthLisa Scott, RN.

## 2014-04-29 NOTE — Progress Notes (Signed)
   04/29/14 1200  Clinical Encounter Type  Visited With Health care provider   Attempted visit x2, but per RN pt has many visitors and needs rest.  Following from a distance and plan f/u visit to offer support as Ms Loletha GrayerLandry copes with unexpected NICU admission.  Please also page as needs arise.  Thank you.  [address] Westminster CourtChaplain Rhodie Cienfuegos [place] Falls[place]unde[place], [place]iv [phone]

## 2014-04-29 NOTE — Progress Notes (Addendum)
Upon assessment of patient at 2nd hourly check patient stated to me that she "felt very hot". Assessment of patient was found to be diaphoretic, pale, clammy and BP was 71/30, O2 sat was 100% on room air, and HR was 50. Patient went unresponsive, eyes were fixated, and patient started foaming at the mouth.  Emergency button was pressed for help and for rapid response. Patient's mouth was suctioned, oxygen was administered, and ammonia was placed under patient's nose. Also fundus was checked to make sure bleeding was WNL.  Patient came around after about a minute with oxygen and ammonia. Rapid response nurse, charge nurse, and anesthesia arrived to assess patient. After several BP checks patients BP's were still low 41/34, 42/30, 51/23 and O2 and HR were all within normal limits. Bleeding was WNL. LR bolus was hung. Anesthesia advised to start patient on phenelprine drip after medication and fluids were given with no increase in blood pressures. 0320 Patient was alert and oriented, but still stated that she felt hot. Blood pressure was better with the latest reading of 86/53. House coverage and charge RN transported patient to AICU.

## 2014-04-29 NOTE — Progress Notes (Addendum)
CTSP  Pt had an episode of passing out with a limited seizure witnessed by family.  She returned to consciousness after ammonia vial and stimulation by nurse.  BP was recorded as systolic 40s while her pulse remained 70-100.   Her sats were 100% and the pt was still placed on O2 witn [place].  Rapid response was called and pt attended by anesthesia who ordered phenylephrine and drip.  When I arrived, the pt was diaphoretic but awake and oriented.  Her color was good although her blood pressures remained in the 80-90/50s range.  Pulse is 100 and slightly thready but regular.  She reported feeling better.   Previous vitals were: Filed Vitals:   04/29/14 0030 04/29/14 0045 04/29/14 0100 04/29/14 0115  BP: 136/82 140/79 128/76 139/84  Pulse: 86 83 88 80  Temp: 98.2 F (36.8 C)  98.4 F (36.9 C) 98 F (36.7 C)  TempSrc:    Oral  Resp: Height:      Weight:      SpO2: 100% 100% 100% 100%    Current vitals: BP 108/68  P 72 SAO2 100 RR 18  Lungs CTA B Cor RRR, no murmurs, rubs or gallops Abd soft, appropriate tenderness, no rebound or guarding; BS decreased; incision intact and not bleeding Ex SCDS on Pelvic fundus firm, small amount normal lochia expressed, no masses or bulging in broad ligament; no signs of bleeding internally  Results for orders placed or performed during the hospital encounter of 04/28/14 (from the past 24 hour(s))  Urinalysis, Routine w reflex microscopic     Status: Abnormal   Collection Time: 04/28/14 11:48 AM  Result Value Ref Range   Color, Urine YELLOW YELLOW   APPearance HAZY (A) CLEAR   Specific Gravity, Urine <1.005 (L) 1.005 - 1.030   pH 6.0 5.0 - 8.0   Glucose, UA NEGATIVE NEGATIVE mg/dL   Hgb urine dipstick NEGATIVE NEGATIVE   Bilirubin Urine NEGATIVE NEGATIVE   Ketones, ur NEGATIVE NEGATIVE mg/dL   Protein, ur NEGATIVE NEGATIVE mg/dL   Urobilinogen, UA 0.2 0.0 - 1.0 mg/dL   Nitrite NEGATIVE NEGATIVE   Leukocytes, UA TRACE (A) NEGATIVE   Protein / creatinine ratio, urine     Status: None   Collection Time: 04/28/14 11:48 AM  Result Value Ref Range   Creatinine, Urine 61.00 mg/dL   Total Protein, Urine 9 mg/dL   Protein Creatinine Ratio 0.15 0.00 - 0.15  Urine microscopic-add on     Status: Abnormal   Collection Time: 04/28/14 11:48 AM  Result Value Ref Range   Squamous Epithelial / LPF FEW (A) RARE   WBC, UA 0-2 <3 WBC/hpf   RBC / HPF 3-6 <3 RBC/hpf   Bacteria, UA MANY (A) RARE  Comprehensive metabolic panel     Status: Abnormal   Collection Time: 04/28/14 12:40 PM  Result Value Ref Range   Sodium 137 135 - 145 mmol/L   Potassium 4.3 3.5 - 5.1 mmol/L   Chloride 106 96 - 112 mmol/L   CO2 19 19 - 32 mmol/L   Glucose, Bld 67 (L) 70 - 99 mg/dL   BUN 9 6 - 23 mg/dL   Creatinine, Ser 1.61 0.50 - 1.10 mg/dL   Calcium 8.9 8.4 - 09.6 mg/dL   Total Protein 6.1 6.0 - 8.3 g/dL   Albumin 3.1 (L) 3.5 - 5.2 g/dL   AST 21 0 - 37 U/L   ALT 19 0 - 35 U/L   Alkaline  Phosphatase 151 (H) 39 - 117 U/L   Total Bilirubin 0.2 (L) 0.3 - 1.2 mg/dL   GFR calc non Af Amer >90 >90 mL/min   GFR calc Af Amer >90 >90 mL/min   Anion gap 12 5 - 15  Uric acid     Status: None   Collection Time: 04/28/14 12:40 PM  Result Value Ref Range   Uric Acid, Serum 5.7 2.4 - 7.0 mg/dL  Lactate dehydrogenase     Status: None   Collection Time: 04/28/14 12:40 PM  Result Value Ref Range   LDH 198 94 - 250 U/L  Type and screen     Status: None   Collection Time: 04/28/14 12:40 PM  Result Value Ref Range   ABO/RH(D) A POS    Antibody Screen NEG    Sample Expiration 05/01/2014   CBC     Status: Abnormal   Collection Time: 04/28/14 12:40 PM  Result Value Ref Range   WBC 11.0 (H) 4.0 - 10.5 K/uL   RBC 4.42 3.87 - 5.11 MIL/uL   Hemoglobin 13.4 12.0 - 15.0 g/dL   HCT 16.1 09.6 - 04.5 %   MCV 87.8 78.0 - 100.0 fL   MCH 30.3 26.0 - 34.0 pg   MCHC 34.5 30.0 - 36.0 g/dL   RDW 40.9 81.1 - 91.4 %   Platelets 265 150 - 400 K/uL  ABO/Rh     Status: None    Collection Time: 04/28/14 12:40 PM  Result Value Ref Range   ABO/RH(D) A POS   CBC     Status: Abnormal   Collection Time: 04/28/14  9:22 PM  Result Value Ref Range   WBC 11.9 (H) 4.0 - 10.5 K/uL   RBC 4.22 3.87 - 5.11 MIL/uL   Hemoglobin 12.7 12.0 - 15.0 g/dL   HCT 78.2 95.6 - 21.3 %   MCV 88.2 78.0 - 100.0 fL   MCH 30.1 26.0 - 34.0 pg   MCHC 34.1 30.0 - 36.0 g/dL   RDW 08.6 57.8 - 46.9 %   Platelets 252 150 - 400 K/uL  CBC     Status: Abnormal   Collection Time: 04/28/14 11:50 PM  Result Value Ref Range   WBC 17.4 (H) 4.0 - 10.5 K/uL   RBC 3.75 (L) 3.87 - 5.11 MIL/uL   Hemoglobin 11.4 (L) 12.0 - 15.0 g/dL   HCT 62.9 (L) 52.8 - 41.3 %   MCV 88.5 78.0 - 100.0 fL   MCH 30.4 26.0 - 34.0 pg   MCHC 34.3 30.0 - 36.0 g/dL   RDW 24.4 01.0 - 27.2 %   Platelets 246 150 - 400 K/uL  CBC     Status: Abnormal   Collection Time: 04/29/14  3:02 AM  Result Value Ref Range   WBC 11.6 (H) 4.0 - 10.5 K/uL   RBC 2.84 (L) 3.87 - 5.11 MIL/uL   Hemoglobin 8.7 (L) 12.0 - 15.0 g/dL   HCT 53.6 (L) 64.4 - 03.4 %   MCV 88.4 78.0 - 100.0 fL   MCH 30.6 26.0 - 34.0 pg   MCHC 34.7 30.0 - 36.0 g/dL   RDW 74.2 59.5 - 63.8 %   Platelets 221 150 - 400 K/uL    A/P  DOS LTCS uncomplicated for nonreassuring FHTS remote from vaginal delivery.  Pt was induced for PIH.    1.  Continue phenylephrine drip for now. 2.  H/H dropped 2 points from PACU but not excessive.  If drops again or unable to  maintain BPs will get US for evaluation of internal bleeding.  Currently there is no signs of internal bleeding or pp hemorrhage. 3.  Continue fluid repletion.  CMET pending. 4.  Pt has hx of vagal episodes in past.  This is probably a vagal episode with slow recovery as all her other vitals are stable and pt is alert and oriented.  Pt has been moved to AICU for further monitoring. Will add EKG to ensure no cardiac disease.

## 2014-04-29 NOTE — Significant Event (Signed)
Rapid Response Event Note  Overview: Time Called: 0238 Arrival Time: 0240 Event Type: Hypotension  Initial Focused Assessment:  Arrived in room 145 to find pt alert and responsive but pale and diaphoretic.  SBP in the 40/50's and DBP 30's.  O2 sats 98-100%.  Pulse 59, resp 18.  RN states pt became hot, then brief period of unresponsiveness with frothy sputum.  LR with pitocin inf at 62.855ml/hr.  LR hung at 999/hr.  Uterus assessed, minimal vag bleeding, U/E and firm.  Anesthesia notified, Dr. Henderson CloudHorvath given report.  Dr. Jean RosenthalJackson in room at 0248, orders rec'd.  Dr. Henderson CloudHorvath on her way to hospital.  0300 Dr. Jean RosenthalJackson and Dr. Henderson CloudHorvath at bedside.  Phenylephrine drip initiated at 1740mcg/min.  BP 57/41, 85, 18 and O2 sat 100%.  BP's improved after IV Phenylephrine pushes per CRNA.  96/60, 84, 18.     Interventions: Phenylephrine drip started per orders Dr. Jean RosenthalJackson, IV fluid bolus.  Orders to transfer to AICU for close observation.  Event Summary:  Pt transferred to AICU at 0330.  Alert and responsive.  VSS on transfer and admission to AICU.    at      at          [facility], Alden BenjaminBrenda Ball

## 2014-04-29 NOTE — Progress Notes (Signed)
Patient is doing well.  Pain control is good.  Lochia is appropriate.  No HA/BV/RUQ/CP/SOB.  Had a rapid response called last night for hypotension and vagal episode w resultant LOC.  She was quickly revived w stimulation by nursing.  BPs were noted to be 40s systolic at start of episode.  Pulse remained stable at 70-100  She was seen and examined by Dr. Jean RosenthalJackson w anesthesia and Dr. Henderson CloudHorvath.  She was started on a phenylephrine drip for BPs in the .  EKG w nl sinus rhythm.  Does not sound c/w seizure given no post-ictal period.  Pt reports h/o vagal episodes previously worked up by neurology with no underlying cause.   Hgb at the time of hypotension was 8.7, down from 11.2.  Repeat hgb this AM is 8.9.  Uop post op initially appropriate at about 75cc/hr.  Now oliguric at ~20cc/hr.   Filed Vitals:   04/29/14 0655 04/29/14 0700 04/29/14 0757 04/29/14 0800  BP:  128/76  121/76  Pulse: 86 81    Temp:   97.7 F (36.5 C)   TempSrc:   Oral   Resp: 16 18  24   Height:      Weight:      SpO2: 96% 94%      NAD RRR Lungs CTA, no crackles Fundus firm.  Abdomen soft, no r/g Peripad w scant heme Ext: trace edema b/l  Lab Results  Component Value Date   WBC 13.5* 04/29/2014   HGB 8.9* 04/29/2014   HCT 26.2* 04/29/2014   MCV 89.1 04/29/2014   PLT 212 04/29/2014    --/--/A POS, A POS (03/02 1240)/RImmune  A/P 24 y.o. G1P1000 P0D#1 w GHTN s/p 1 LTCS 2/2 NRFS remote from delivery.  C/b vagal episode and hypotension.  At this time, has been weaned off phenylephrine drip.  BPs stable in  Hgb stable this AM at 8.9, benign abdominal exam, no evidence of intraabdominal bleeding.   Will allow to eat, ambulate.  If no return of symptom, [facility] Oliguria:IVF at 200cc/hr since 2AM.  May be related to AKI due to transient hypotension during vagal response.  Again, with stable vital signs and hemoglobin, I do not suspect that oliguria is related to ongoing blood loss. 1L fluid bolus now and will  check BMP for Cr. Maintain foley catheter.       OleanLARK, [facility] Memorial HospitalDYANNA

## 2014-04-29 NOTE — Progress Notes (Signed)
I was alerted by the RN of intermittent tachycardia.  Pulse throughout most of the day has been 90-110.  Over the last two hours, intermittent tachycardia to the 120-140s was noted.  Patient feels well.  Denies cp/sob/dizziness upon ambulation.  She notes significant anxiety due to not being able to see her baby in NICU since this AM.  Reports that HR increase is assoc w inc anxiety/tearfulness.   Orthostatic vitals were performed. Lying 132/70 P 112, Standing 112/95 P 146 Technically positive due to the 20 point drop in systolic BP.     NAD Lungs: CTAB Heart: tachycardic in low 100s, reg rhythm Abd: +BS, soft, nontender, nondistended, fundus firm Perineum: scant bleeding  In: 1600 cc/12hr Out:780/24h urine, but 20cc/hr over last 12 hr, EBL 600 cc  Foley in place, very dark concentrated urine  EKG performed and sinus tach CBC repeated 8.7-->8.9-->7.9  I think the patient's current tachycardia represents a combination of dehydration and anxiety.  Slight decrease in hemoglobin likely represents hemodilution.  Vaginal bleeding is scant, fundus is firm and abdominal exam is benign.   Cr was normal at 0.8 this AM.  Bladder scan shows scant urine, so foley functioning well. Will give an additional 500cc fluid bolus now and encourage the patient to ambulate.  Will cont IVF at 150cc/hr until uop increases.  Will plan to repeat BMP/CBC in AM.  If oliguria continues, can consider renal US in AM; however I think that this will be low yield in the setting of a normal creatinine.   Ativan as needed for anxiety. OK to transfer to floor and travel to NICU.

## 2014-04-29 NOTE — Anesthesia Postprocedure Evaluation (Signed)
  Anesthesia Post Note  Patient: Nina Nguyen  Procedure(s) Performed: Procedure(s) (LRB): CESAREAN SECTION (N/A)  Anesthesia type: epidural  Patient location: PACU  Post pain: Pain level controlled  Post assessment: Post-op Vital signs reviewed  Last Vitals:  Filed Vitals:   04/28/14 2330  BP: 111/58  Pulse:   Temp:   Resp:     Post vital signs: Reviewed  Level of consciousness: awake  Complications: No apparent anesthesia complications

## 2014-04-29 NOTE — Progress Notes (Signed)
Admission nutrition screen triggered for unintentional weight loss > 10 lbs within the last month. . Patients chart reviewed and assessed  for nutritional risk. Patient is determined to be at low nutrition  risk.    Phill Steck M.Ed. R.D. LDN Neonatal Nutrition Support Specialist/RD III Pager [phone]  

## 2014-04-29 NOTE — Addendum Note (Signed)
Addendum  created 04/29/14 0744 by Jhonnie GarnerBeth M Willmar Stockinger, CRNA   Modules edited: Notes Section   Notes Section:  File: [id number]

## 2014-04-30 ENCOUNTER — Encounter (HOSPITAL_COMMUNITY): Payer: Self-pay | Admitting: Obstetrics and Gynecology

## 2014-04-30 LAB — BIRTH TISSUE RECOVERY COLLECTION (PLACENTA DONATION)

## 2014-04-30 MED ORDER — LACTATED RINGERS IV BOLUS (SEPSIS): 500.0000 mL | Freq: Once | INTRAVENOUS | Status: DC

## 2014-04-30 MED ORDER — LACTATED RINGERS IV BOLUS (SEPSIS)
1000.0000 mL | Freq: Once | INTRAVENOUS | Status: AC
  Administered 2014-04-30: 1000 mL via INTRAVENOUS

## 2014-04-30 NOTE — Progress Notes (Signed)
Subjective: Postpartum Day 2: Cesarean Delivery Patient reports pain controlled, no nausea or vomiting, ambulating without difficulty. Urinating. Denies dizziness or pre-syncope    Objective: Vital signs in last 24 hours: Temp:  [97.6 F (36.4 C)-98.5 F (36.9 C)] 98.1 F (36.7 C) (03/04 0502) Pulse Rate:  [88-146] 88 (03/04 0502) Resp:  [14-28] 18 (03/04 0502) BP: (112-138)/(65-95) 138/75 mmHg (03/04 0502) SpO2:  [95 %-100 %] 96 % (03/04 0502)  Physical Exam:  General: alert, cooperative and appears stated age Lochia: appropriate Uterine Fundus: firm Incision: healing well DVT Evaluation: No evidence of DVT seen on physical exam.   Recent Labs  04/29/14 0805 04/29/14 1503  HGB 8.9* 7.9*  HCT 26.2* 23.4*    Assessment/Plan: Status post Cesarean section. Doing well postoperatively.  Continue current care.  Kaile Bixler H. 04/30/2014, 10:40 AM

## 2014-04-30 NOTE — Lactation Note (Signed)
This note was copied from the chart of Nina Nguyen Jocelyn. Lactation Consultation Note  Patient Name: Nina Nguyen Shellenbarger ZOXWR'UToday's Date: 04/30/2014 Reason for consult: Follow-up assessment   Follow up with mom in NICU.  Mom stated she has pumped once (yesterday) and has breastfed the baby 3x since admission to NICU.  Mom has not pumped today.  Discussed supply / demand and importance of pumping in addition to latching infant for initiating milk supply and maintaining milk supply.  Encouraged mom to keep pumping log in NICU booklet and to pump at least 8 times per day.  Encouraged mom to bring pump pieces to NICU with her.  Mom may be discharged on Saturday or Sunday.  Mom has an [employer] DEBP she received as a gift.  Mom has insurance & Medicaid.  Discussed with mom to call insurance company about benefits and potentially acquiring pump from [employer]urance company.  Explained the need for a DEBP that will help her maintain a milk supply.  Discussed pump options for discharge with mom (using [employer], [employer]calling insurance company, renting a 2-week rental, etc...).  2-week rental packet left with mom and explained how to complete paperwork and money required for rental.  Mom will consider her options for discharge.  Will need to be followed-up at discharge regarding pump needs.      Maternal Data    Feeding Feeding Type: Formula Nipple Type: Slow - flow Length of feed: 5 min  LATCH Score/Interventions Latch: Grasps breast easily, tongue down, lips flanged, rhythmical sucking. Intervention(s): Adjust position  Audible Swallowing: A few with stimulation Intervention(s): Skin to skin Intervention(s): Skin to skin  Type of Nipple: Everted at rest and after stimulation  Comfort (Breast/Nipple): Soft / non-tender     Hold (Positioning): No assistance needed to correctly position infant at breast. Intervention(s): Skin to skin  LATCH Score: 9  Lactation Tools Discussed/Used     Consult Status       Lendon KaVann, Aleck Locklin Walker 04/30/2014, 7:27 PM

## 2014-04-30 NOTE — Progress Notes (Signed)
Clinical Social Work Department PSYCHOSOCIAL ASSESSMENT - MATERNAL/CHILD 04/30/2014  Patient:  Nina Nguyen, Nina Nguyen  Account Number:  [id number]  [place] Date:  04/28/2014  Ardine Eng Name:   Dewaine Oats    Clinical Social Worker:  Terri Piedra, LCSW   Date/Time:  04/30/2014 11:00 AM  Date Referred:        Other referral source:   No referral-NICU admission    I:  FAMILY / [place] legal guardian:  PARENT  Guardian - Name Guardian - Age Guardian - Address  Nina Nguyen [address] [place], [place] [address]  Stann Ore   Other household support members/support persons Name Relationship DOB   MOTHER    Other support:   MOB states that she has a good support person, in addition to FOB, she states that her mother, her aunt and PGM are involved and supportive.    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:    School / Grade:   Maternity Care Coordinator / Child Services Coordination / Early Interventions:  Cultural issues impacting care:   None stated    III  STRENGTHS Strengths  Adequate Resources  Compliance with medical plan  Home prepared for Child (including basic supplies)  Other - See comment  Supportive family/friends  Understanding of illness   Strength comment:  Pediatric follow up will be at [facility] Current Problem:  None   Risk Factor & Current Problem Patient Issue Family Issue Risk Factor / Current Problem Comment   N N     V  SOCIAL WORK ASSESSMENT  CSW met with MOB and FOB in MOB's third floor room/317 to introduce myself, offer support and complete assessment due to baby's admission to NICU at 40.1 weeks.  Parents were pleasant and receptive to CSW intervention.  They  were both fairly quiet, and MOB was more engaged in the conversation than FOB.  MOB reports that they are co-parenting at this time and not in a relationship.  This is her first child and FOB's second.  He has a 46 year old boy named Braylon.  MOB states baby is doing much better, which in turn is making her feel much better.  She appears to have a good understanding of baby's medical situation.  She admits that yesterday was a tough day emotionally.  She also states that she had a very scary event after delivery where she went unconscious and started foaming at the mouth.  CSW talked about how her physical experience as well as her baby's need for ICU intervention could bring up emotions months/years from now and briefly discussed PTSD symptoms.  CSW explained that she may need to speak with a counselor if she experiences symptoms.  CSW discussed this in hopes to bring awareness to this issue.  CSW also discussed common emotions in the PP period, especially in light of baby's NICU admission, as well as signs and symptoms of PPD.  MOB was easily engaged and receptive to education provided.  She states no emotional concerns at this time and agrees to contact CSW and or her doctor if she has emotional concerns at any time.  She reports having a great support systems and everything she needs for baby at this time.  She smiled frequently while we talked, especially as she talked about baby and about becoming a mother.  Bonding is evident.  She is aware of the possibility that baby may have to stay in the hospital longer than she will.  She states no issues with transportation if this is the case.  She lives with her mother and her mother will drive her since she had a c-section.  CSW explained ongoing support services offered by NICU CSW and gave contact information.  CSW identifies no social concerns at this time or barriers to discharge when baby is medically ready.    VI SOCIAL WORK PLAN Social Work Plan   Psychosocial Support/Ongoing Assessment of Needs  Patient/Family Education   Type of pt/family education:   Ongoing support services offered by NICU CSW  PPD signs and symptoms   If child protective services report - county:   If child protective services report - date:   Information/referral to community resources comment:   No referral needs noted at this time.   Other social work plan:

## 2014-05-01 ENCOUNTER — Ambulatory Visit: Payer: Self-pay

## 2014-05-01 MED ORDER — DOCUSATE SODIUM 100 MG PO CAPS: 100.0000 mg | ORAL_CAPSULE | Freq: Two times a day (BID) | ORAL | Status: AC

## 2014-05-01 MED ORDER — IBUPROFEN 600 MG PO TABS: 600.0000 mg | ORAL_TABLET | Freq: Four times a day (QID) | ORAL | Status: AC | PRN

## 2014-05-01 MED ORDER — OXYCODONE-ACETAMINOPHEN 5-325 MG PO TABS: 1.0000 | ORAL_TABLET | ORAL | Status: AC | PRN

## 2014-05-01 NOTE — Progress Notes (Signed)

## 2014-05-01 NOTE — Discharge Summary (Signed)
Obstetric Discharge Summary Reason for Admission: induction of labor Prenatal Procedures: NST and ultrasound Intrapartum Procedures: cesarean: low cervical, transverse Postpartum Procedures: none Complications-Operative and Postpartum: none HEMOGLOBIN  Date Value Ref Range Status  04/29/2014 7.9* 12.0 - 15.0 g/dL Final   HCT  Date Value Ref Range Status  04/29/2014 23.4* 36.0 - 46.0 % Final    Physical Exam:  General: alert, cooperative and appears stated age 59Lochia: appropriate Uterine Fundus: firm Incision: healing well DVT Evaluation: No evidence of DVT seen on physical exam.  Discharge Diagnoses: Term Pregnancy-delivered, transient maternal hypotension requiring IV pressures in ICU.  Discharge Information: Date: 05/01/2014 Activity: pelvic rest Diet: routine Medications: Ibuprofen, Colace and Percocet Condition: improved Instructions: refer to practice specific booklet Discharge to: home Follow-up Information    Follow up with Nina A, MD In 4 weeks.   Specialty:  Obstetrics and Gynecology   Why:  For Nguyen postoperative visit   Contact information:   [name], [name]. 05/01/2014, 11:01 AM

## 2014-05-01 NOTE — Lactation Note (Signed)
This note was copied from the chart of Nina Nguyen Osbourne. Lactation Consultation Note  Patient Name: Nina Nguyen Doro ZOXWR'UToday's Date: 05/01/2014 Reason for consult: Follow-up assessment;NICU baby Mom attempting to latch baby in cradle hold and baby not obtaining good depth. LC assisted Mom with positioning and how to perform breast compression to help baby obtain more depth with latch. On the left breast baby was sleepy and both nutrtive/non-nutritive suckling observed. On the right breast baby was more awake and demonstrated and more sustained suckling pattern with some good suckling bursts observed. Swallowing motions noted. Mom reports she is not getting colostrum with hand expression. LC did not obtain colostrum at this visit with hand expression prior to baby nursing, however baby appeared to have colostrum on his face after nursing on the right breast.  She started pumping yesterday and received this am 1-2 ml of colostrum with pumping. Mom reports little breast changes early pregnancy and LC observes Mom has wide spacing between breasts with tubular shape to breasts. Baby is also noted to have short lingual frenulum.  Plan discussed with Mom for tonight and for d/c home: BF with each feeding, advised baby should be at the breast 8-12 times in 24 hours and with feeding ques. Encouraged to keep baby actively nursing for 15-20 minutes both breasts each feeding. Continue to supplement with 30 ml of EBM/formula till milk comes to volume increasing as needed for baby to be satisfied at the breast.  Post pump after feedings to encourage milk production. Engorgement care reviewed if needed.  OP/fu scheduled with lactation for Thursday, 05/06/14 at 0900. Mom decided to rent pump for 2 weeks. Rental paper left with Mom to complete. Will need to finish pump rental before d/c home.   Maternal Data    Feeding Feeding Type: Formula Nipple Type: Slow - flow Length of feed: 15 min  LATCH  Score/Interventions Latch: Grasps breast easily, tongue down, lips flanged, rhythmical sucking. Intervention(s): Adjust position;Assist with latch;Breast massage;Breast compression  Audible Swallowing: A few with stimulation  Type of Nipple: Everted at rest and after stimulation  Comfort (Breast/Nipple): Soft / non-tender     Hold (Positioning): Assistance needed to correctly position infant at breast and maintain latch. Intervention(s): Breastfeeding basics reviewed;Support Pillows;Position options;Skin to skin  LATCH Score: 8  Lactation Tools Discussed/Used Tools: Pump Breast pump type: Double-Electric Breast Pump   Consult Status Consult Status: Follow-up Date: 05/01/14 Follow-up type: In-patient    Alfred LevinsGranger, Kharis Lapenna Ann 05/01/2014, 4:58 PM

## 2014-05-01 NOTE — Lactation Note (Signed)
This note was copied from the chart of Boy Otilio SaberKelsey Rathman. Lactation Consultation Note  Patient Name: Boy Otilio SaberKelsey Smay MVHQI'OToday's Date: 05/01/2014 Reason for consult: Follow-up assessment;NICU baby Mom reports baby has been latching and she feels doing well. Baby having circumcision this am. Mom reports she plans to room in tonight. Unsure of pump rental, may use own [employer] pump. LC left phone number for Mom to call with next feeding to observe latch and discuss need for pump for d/c home.   Maternal Data    Feeding Feeding Type: Formula Nipple Type: Slow - flow Length of feed: 20 min  LATCH Score/Interventions                      Lactation Tools Discussed/Used Tools: Pump Breast pump type: Double-Electric Breast Pump   Consult Status Consult Status: Follow-up Date: 05/01/14 Follow-up type: In-patient    Alfred LevinsGranger, Lynnell Fiumara Ann 05/01/2014, 12:07 PM

## 2014-05-03 ENCOUNTER — Inpatient Hospital Stay (HOSPITAL_COMMUNITY): Payer: BLUE CROSS/BLUE SHIELD

## 2014-05-06 ENCOUNTER — Ambulatory Visit (HOSPITAL_COMMUNITY): Admission: RE | Admit: 2014-05-06 | Payer: BLUE CROSS/BLUE SHIELD | Source: Ambulatory Visit

## 2015-11-02 IMAGING — US US OB DETAIL+14 WK
1 series · 12 of 28 positions shown · non-contrast
Comparison: none

[Series 1: us ob detail+14 wk · 12 of 89 slices shown]
[im 4/89]
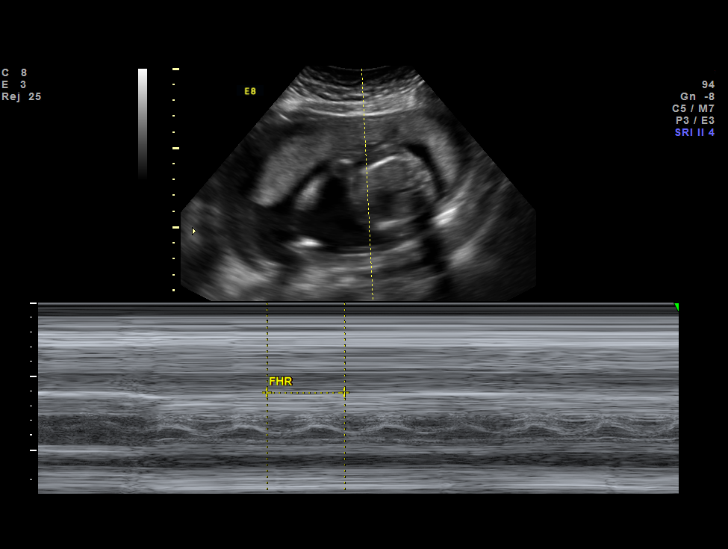
[im 10/89]
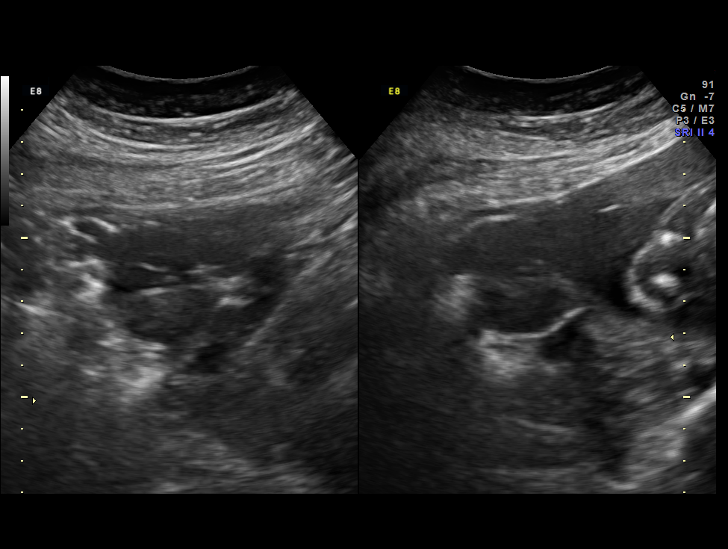
[im 17/89]
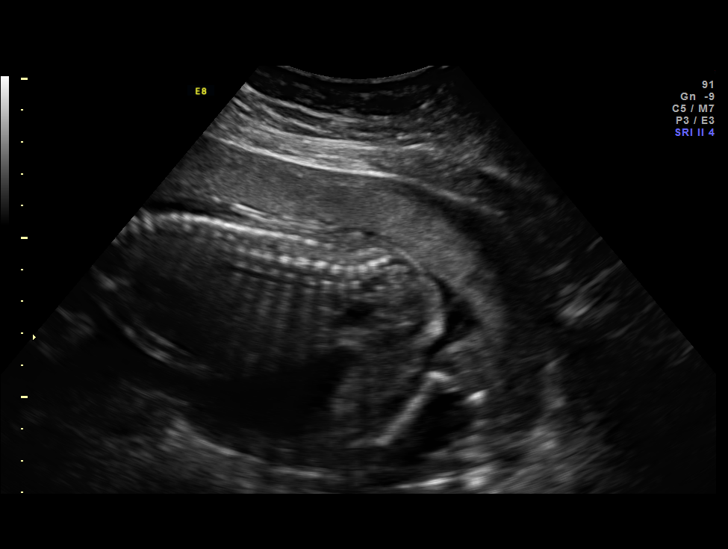
[im 27/89]
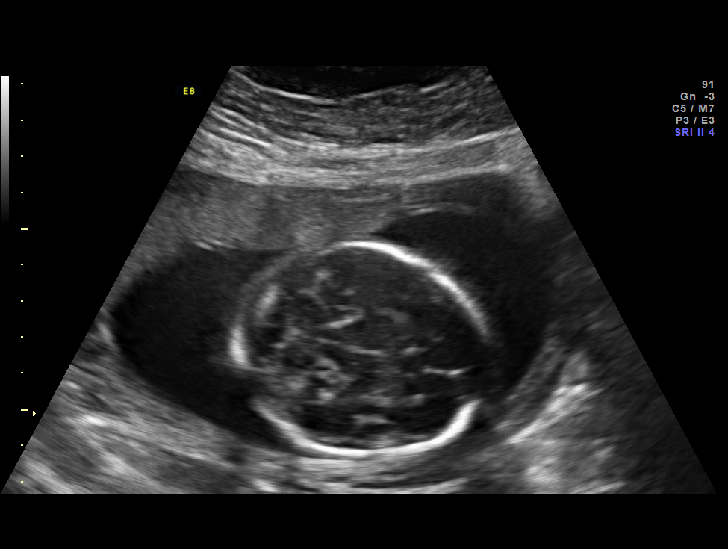
[im 33/89]
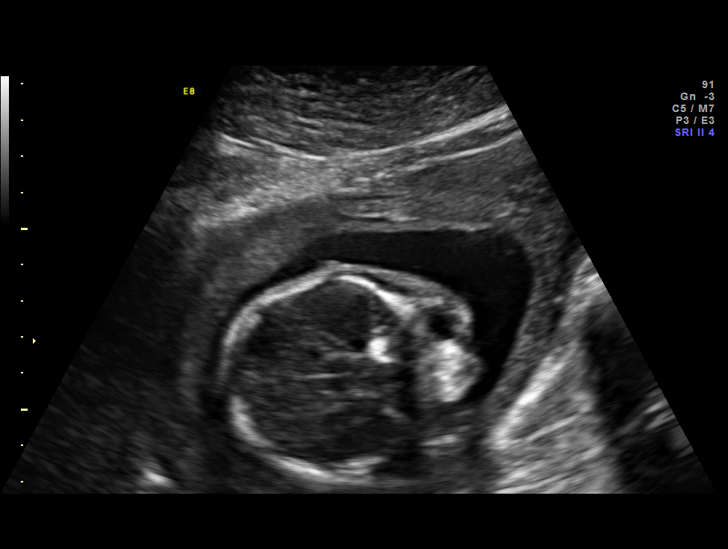
[im 40/89]
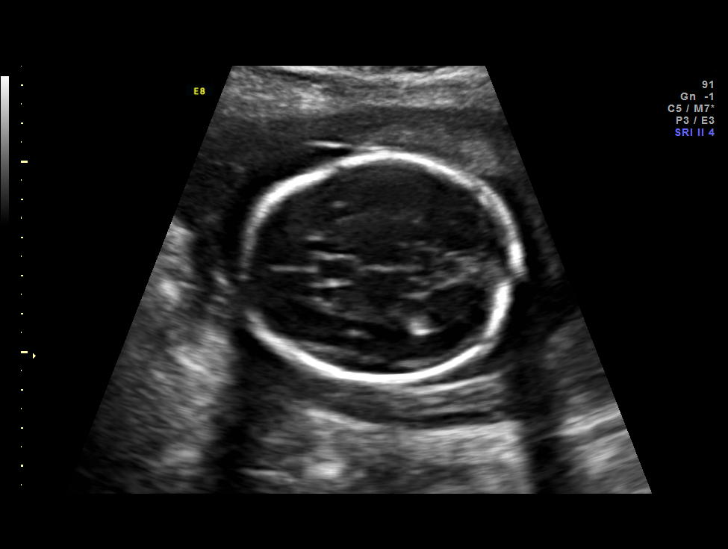
[im 49/89]
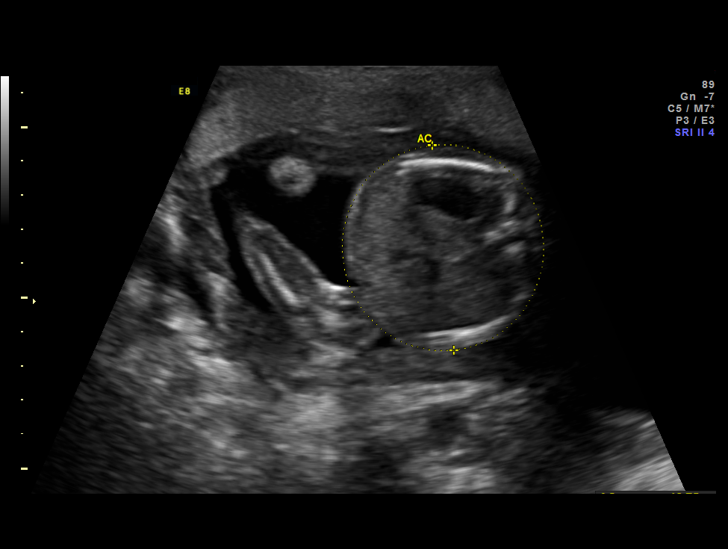
[im 56/89]
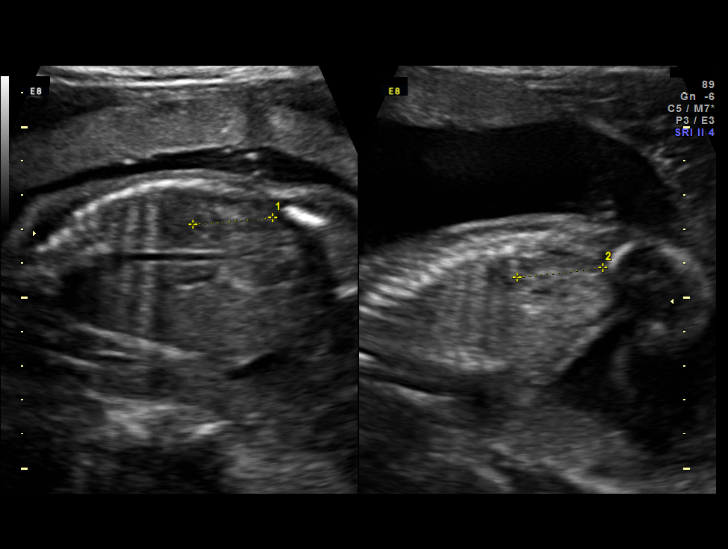
[im 62/89]
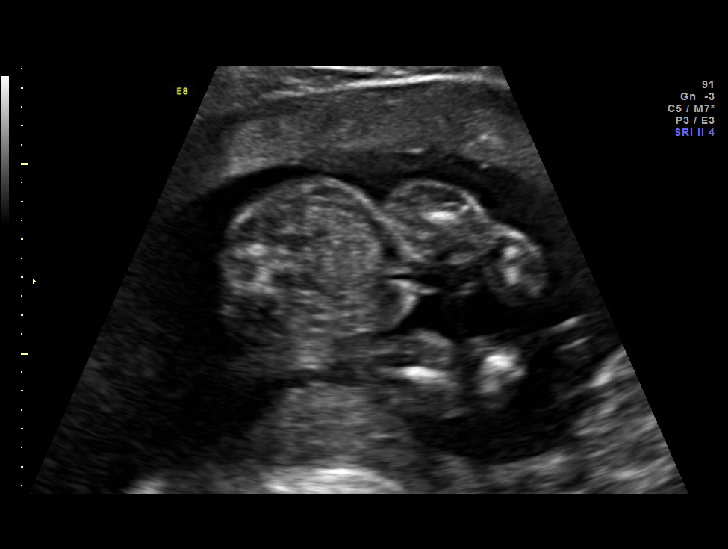
[im 72/89]
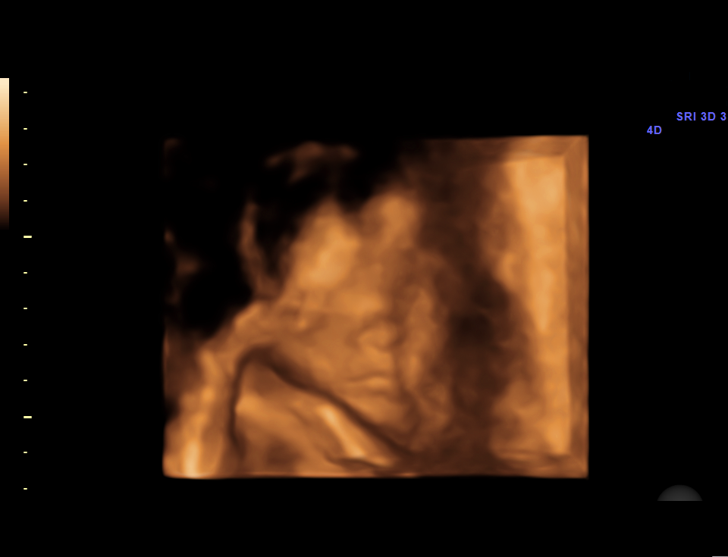
[im 79/89]
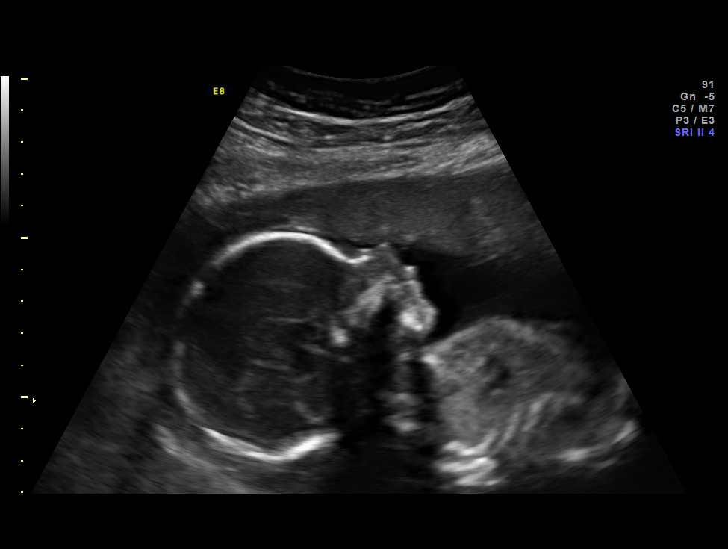
[im 85/89]
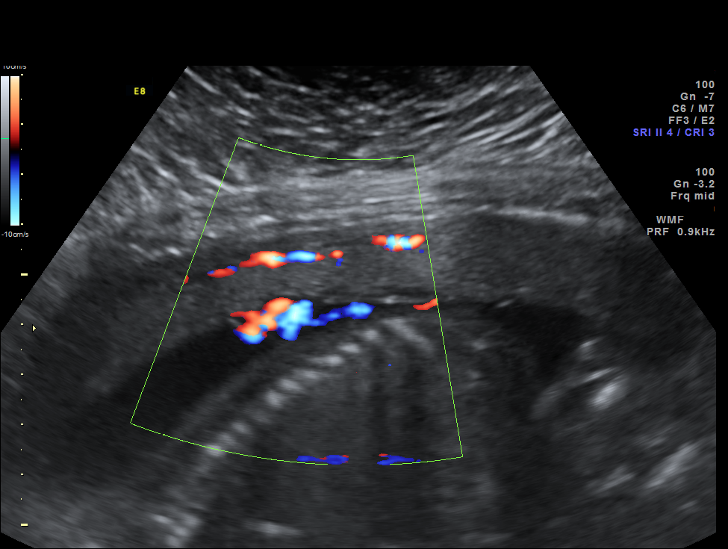

[12 of 28 positions shown; findings below may reference images not displayed]

OBSTETRICS REPORT
                      (Signed Final 12/31/2013 [DATE])

Service(s) Provided

 US OB DETAIL + 14 WK                                  76811.0
Indications

 Detailed fetal anatomic survey                        Z36
 Follow-up incomplete fetal anatomic evaluation        Z36
 from outside scan
 23 weeks gestation of pregnancy
 Obesity complicating pregnancy, second trimester
 Fetal abnormality - other known or suspected
 (heart views)
Fetal Evaluation

 Num Of Fetuses:    1
 Fetal Heart Rate:  130                          bpm
 Cardiac Activity:  Observed
 Presentation:      Transverse, head to
                    maternal right
 Placenta:          Anterior, above cervical os

 Amniotic Fluid
 AFI FV:      Subjectively within normal limits
                                             Larg Pckt:     6.1  cm
Biometry

 BPD:     58.4  mm     G. Age:  23w 6d                CI:         76.7   70 - 86
 OFD:     76.1  mm                                    FL/HC:      18.3   19.2 -

 HC:     216.4  mm     G. Age:  23w 4d       57  %    HC/AC:      1.15   1.05 -

 AC:     188.1  mm     G. Age:  23w 4d       56  %    FL/BPD:     67.6   71 - 87
 FL:      39.5  mm     G. Age:  22w 5d       25  %    FL/AC:      21.0   20 - 24
 HUM:     36.3  mm     G. Age:  22w 5d       32  %
 CER:     24.8  mm     G. Age:  22w 6d       45  %

 Est. FW:     580  gm      1 lb 4 oz     56  %
Gestational Age

 LMP:           23w 1d        Date:  07/21/13                 EDD:   04/27/14
 U/S Today:     23w 3d                                        EDD:   04/25/14
 Best:          23w 1d     Det. By:  LMP  (07/21/13)          EDD:   04/27/14
Anatomy

 Cranium:          Appears normal         Aortic Arch:      Appears normal
 Fetal Cavum:      Appears normal         Ductal Arch:      Appears normal
 Ventricles:       Appears normal         Diaphragm:        Appears normal
 Choroid Plexus:   Appears normal         Stomach:          Appears normal, left
                                                            sided
 Cerebellum:       Appears normal         Abdomen:          Appears normal
 Posterior Fossa:  Appears normal         Abdominal Wall:   Appears nml (cord
                                                            insert, abd wall)
 Nuchal Fold:      Not applicable (>20    Cord Vessels:     Appears normal (3
                   wks GA)                                  vessel cord)
 Face:             Appears normal         Kidneys:          Appear normal
                   (orbits and profile)
 Lips:             Appears normal         Bladder:          Appears normal
 Heart:            Appears normal         Spine:            Appears normal
                   (4CH, axis, and
                   situs)
 RVOT:             Appears normal         Lower             Visualized
                                          Extremities:
 LVOT:             Appears normal         Upper             Visualized
                                          Extremities:

 Other:  Fetus appears to be a male. Right 5th digit visualized. Nasal bone
         visualized. Complete fetal anatomic survey previously performed in
         office.
Targeted Anatomy

 Fetal Central Nervous System
 Lat. Ventricles:  6.3                    Cisterna Magna:
Cervix Uterus Adnexa

 Cervical Length:    4.6      cm

 Cervix:       Normal appearance by transabdominal scan.

 Left Ovary:    Within normal limits.
 Right Ovary:   Within normal limits.

 Adnexa:     No abnormality visualized.
Impression

 SIUP at 23+1 weeks
 Normal detailed fetal anatomy; all heart views visualized and
 appeared to be normal
 Normal amniotic fluid volume
 Measurements consistent with LMP dating; EFW at the 56th
 %tile
Recommendations

 Follow-up as clinically indicated

 questions or concerns.
